# Patient Record
Sex: Female | Born: 1960 | Race: White | Hispanic: No | Marital: Married | Smoking: Never smoker
Health system: Southern US, Community
[De-identification: ages and names within clinical notes are randomized; demographics above are authoritative.]

## PROBLEM LIST (undated history)

## (undated) DIAGNOSIS — K635 Polyp of colon: Secondary | ICD-10-CM

## (undated) DIAGNOSIS — K649 Unspecified hemorrhoids: Secondary | ICD-10-CM

## (undated) DIAGNOSIS — K449 Diaphragmatic hernia without obstruction or gangrene: Secondary | ICD-10-CM

## (undated) DIAGNOSIS — G43909 Migraine, unspecified, not intractable, without status migrainosus: Secondary | ICD-10-CM

## (undated) DIAGNOSIS — J302 Other seasonal allergic rhinitis: Secondary | ICD-10-CM

## (undated) DIAGNOSIS — K2980 Duodenitis without bleeding: Secondary | ICD-10-CM

## (undated) DIAGNOSIS — K589 Irritable bowel syndrome without diarrhea: Secondary | ICD-10-CM

## (undated) DIAGNOSIS — E785 Hyperlipidemia, unspecified: Secondary | ICD-10-CM

## (undated) DIAGNOSIS — Z800 Family history of malignant neoplasm of digestive organs: Secondary | ICD-10-CM

## (undated) DIAGNOSIS — K259 Gastric ulcer, unspecified as acute or chronic, without hemorrhage or perforation: Secondary | ICD-10-CM

## (undated) DIAGNOSIS — K222 Esophageal obstruction: Secondary | ICD-10-CM

## (undated) DIAGNOSIS — Z8051 Family history of malignant neoplasm of kidney: Secondary | ICD-10-CM

## (undated) DIAGNOSIS — R0602 Shortness of breath: Secondary | ICD-10-CM

## (undated) DIAGNOSIS — L900 Lichen sclerosus et atrophicus: Secondary | ICD-10-CM

## (undated) HISTORY — DX: Migraine, unspecified, not intractable, without status migrainosus: G43.909

## (undated) HISTORY — DX: Shortness of breath: R06.02

## (undated) HISTORY — DX: Esophageal obstruction: K22.2

## (undated) HISTORY — PX: COLONOSCOPY W/ BIOPSIES: SHX1374

## (undated) HISTORY — DX: Gastric ulcer, unspecified as acute or chronic, without hemorrhage or perforation: K25.9

## (undated) HISTORY — DX: Other seasonal allergic rhinitis: J30.2

## (undated) HISTORY — DX: Unspecified hemorrhoids: K64.9

## (undated) HISTORY — DX: Duodenitis without bleeding: K29.80

## (undated) HISTORY — DX: Polyp of colon: K63.5

## (undated) HISTORY — DX: Family history of malignant neoplasm of kidney: Z80.51

## (undated) HISTORY — DX: Family history of malignant neoplasm of digestive organs: Z80.0

## (undated) HISTORY — DX: Diaphragmatic hernia without obstruction or gangrene: K44.9

## (undated) HISTORY — DX: Lichen sclerosus et atrophicus: L90.0

---

## 2009-11-16 ENCOUNTER — Ambulatory Visit: Payer: Self-pay

## 2010-11-22 ENCOUNTER — Ambulatory Visit: Payer: Self-pay

## 2011-03-08 ENCOUNTER — Ambulatory Visit: Payer: Self-pay | Admitting: Unknown Physician Specialty

## 2011-03-12 LAB — PATHOLOGY REPORT

## 2012-12-01 ENCOUNTER — Ambulatory Visit: Payer: Self-pay

## 2014-07-26 ENCOUNTER — Ambulatory Visit: Payer: Self-pay | Admitting: Nurse Practitioner

## 2014-11-11 DIAGNOSIS — Z800 Family history of malignant neoplasm of digestive organs: Secondary | ICD-10-CM

## 2014-11-11 HISTORY — DX: Family history of malignant neoplasm of digestive organs: Z80.0

## 2014-11-29 ENCOUNTER — Encounter: Payer: Self-pay | Admitting: *Deleted

## 2014-11-30 ENCOUNTER — Ambulatory Visit: Payer: No Typology Code available for payment source | Admitting: Anesthesiology

## 2014-11-30 ENCOUNTER — Encounter: Payer: Self-pay | Admitting: Anesthesiology

## 2014-11-30 ENCOUNTER — Encounter
Admission: RE | Disposition: A | Discharge status: Home / Self Care | Payer: Self-pay | Source: Ambulatory Visit | Attending: Unknown Physician Specialty

## 2014-11-30 ENCOUNTER — Ambulatory Visit
Admission: RE | Admit: 2014-11-30 | Discharge: 2014-11-30 | Disposition: A | Discharge status: Home / Self Care | Payer: No Typology Code available for payment source | Source: Ambulatory Visit | Attending: Unknown Physician Specialty | Admitting: Unknown Physician Specialty

## 2014-11-30 DIAGNOSIS — Z79899 Other long term (current) drug therapy: Secondary | ICD-10-CM | POA: Diagnosis not present

## 2014-11-30 DIAGNOSIS — K319 Disease of stomach and duodenum, unspecified: Secondary | ICD-10-CM | POA: Insufficient documentation

## 2014-11-30 DIAGNOSIS — E785 Hyperlipidemia, unspecified: Secondary | ICD-10-CM | POA: Diagnosis not present

## 2014-11-30 DIAGNOSIS — K222 Esophageal obstruction: Secondary | ICD-10-CM | POA: Insufficient documentation

## 2014-11-30 DIAGNOSIS — K2980 Duodenitis without bleeding: Secondary | ICD-10-CM | POA: Diagnosis not present

## 2014-11-30 DIAGNOSIS — R1310 Dysphagia, unspecified: Secondary | ICD-10-CM | POA: Insufficient documentation

## 2014-11-30 DIAGNOSIS — K259 Gastric ulcer, unspecified as acute or chronic, without hemorrhage or perforation: Secondary | ICD-10-CM | POA: Diagnosis not present

## 2014-11-30 DIAGNOSIS — K589 Irritable bowel syndrome without diarrhea: Secondary | ICD-10-CM | POA: Diagnosis not present

## 2014-11-30 HISTORY — PX: SAVORY DILATION: SHX5439

## 2014-11-30 HISTORY — PX: ESOPHAGOGASTRODUODENOSCOPY: SHX5428

## 2014-11-30 HISTORY — DX: Hyperlipidemia, unspecified: E78.5

## 2014-11-30 HISTORY — DX: Irritable bowel syndrome, unspecified: K58.9

## 2014-11-30 SURGERY — EGD (ESOPHAGOGASTRODUODENOSCOPY)
Anesthesia: General

## 2014-11-30 MED ORDER — PROPOFOL 10 MG/ML IV BOLUS
INTRAVENOUS | Status: DC | PRN
  Administered 2014-11-30: 50 mg via INTRAVENOUS

## 2014-11-30 MED ORDER — FENTANYL CITRATE (PF) 100 MCG/2ML IJ SOLN
INTRAMUSCULAR | Status: DC | PRN
  Administered 2014-11-30: 50 ug via INTRAVENOUS

## 2014-11-30 MED ORDER — SODIUM CHLORIDE 0.9 % IV SOLN
INTRAVENOUS | Status: DC
  Administered 2014-11-30: 1000 mL via INTRAVENOUS

## 2014-11-30 MED ORDER — SODIUM CHLORIDE 0.9 % IV SOLN: INTRAVENOUS | Status: DC

## 2014-11-30 MED ORDER — GLYCOPYRROLATE 0.2 MG/ML IJ SOLN
INTRAMUSCULAR | Status: DC | PRN
  Administered 2014-11-30: 0.2 mg via INTRAVENOUS

## 2014-11-30 MED ORDER — PROPOFOL INFUSION 10 MG/ML OPTIME
INTRAVENOUS | Status: DC | PRN
  Administered 2014-11-30: 200 ug/kg/min via INTRAVENOUS

## 2014-11-30 MED ORDER — MIDAZOLAM HCL 5 MG/5ML IJ SOLN
INTRAMUSCULAR | Status: DC | PRN
  Administered 2014-11-30: 1 mg via INTRAVENOUS

## 2014-11-30 MED ORDER — LIDOCAINE HCL (PF) 2 % IJ SOLN
INTRAMUSCULAR | Status: DC | PRN
  Administered 2014-11-30: 50 mg

## 2014-11-30 NOTE — Op Note (Signed)
[facility] Gastroenterology Patient Name: Patricia LenteVickie Radel Procedure Date: 11/30/2014 11:33 AM MRN: [id number] Account #: [id number] Date of Birth: [birth year] Admit Type: Outpatient Age: 54 [facility] 4 Gender: Female Note Status: Finalized Procedure:         Upper GI endoscopy Indications:       Dysphagia Providers:         Scot Junobert T. Terence Bart, MD Referring MD:      Gasper Lloydolleen L. Sharen HonesGutierrez (Referring MD) Medicines:         Propofol per Anesthesia Complications:     No immediate complications. Procedure:         Pre-Anesthesia Assessment:                    - After reviewing the risks and benefits, the patient was                     deemed in satisfactory condition to undergo the procedure.                    After obtaining informed consent, the endoscope was passed                     under direct vision. Throughout the procedure, the                     patient's blood pressure, pulse, and oxygen saturations                     were monitored continuously. The Olympus GIF-160 endoscope                     (S#. [id number]) was introduced through the mouth, and                     advanced to the second part of duodenum. The upper GI                     endoscopy was accomplished without difficulty. The patient                     tolerated the procedure well. Findings:      A mild Schatzki ring (acquired) was found at the gastroesophageal       junction. A TTS dilator was passed through the scope. Dilation with a       15-16.5-18 mm balloon (to a maximum balloon size of 18 mm) dilator was       performed. Effect was good with fraacture of the ring.      Few non-bleeding linear gastric ulcers with pigmented material were       found in the gastric body. Biopsies were taken with a cold forceps for       histology. Biopsies were taken with a cold forceps for Helicobacter       pylori testing.      Patchy moderate inflammation characterized by erosions, erythema  and       granularity was found in the duodenal bulb and in the second part of the       duodenum. Impression:        - Mild Schatzki ring. Dilated.                    - Gastric ulcers. Biopsied.                    -  Duodenitis. Recommendation:    - Await pathology results. Scot Jun, MD 11/30/2014 11:59:50 AM This report has been signed electronically. Number of Addenda: 0 Note Initiated On: 11/30/2014 11:33 AM      [facility]

## 2014-11-30 NOTE — Anesthesia Preprocedure Evaluation (Signed)
Anesthesia Evaluation  Patient identified by MRN, date of birth, ID band Patient awake    Reviewed: Allergy & Precautions, NPO status , Patient's Chart, lab work & pertinent test results, reviewed documented beta blocker date and time   Airway Mallampati: II  TM Distance: >3 FB     Dental  (+) Chipped   Pulmonary          Cardiovascular     Neuro/Psych    GI/Hepatic   Endo/Other    Renal/GU      Musculoskeletal   Abdominal   Peds  Hematology   Anesthesia Other Findings   Reproductive/Obstetrics                             Anesthesia Physical Anesthesia Plan  ASA: II  Anesthesia Plan: General   Post-op Pain Management:    Induction: Intravenous  Airway Management Planned: Nasal Cannula  Additional Equipment:   Intra-op Plan:   Post-operative Plan:   Informed Consent: I have reviewed the patients History and Physical, chart, labs and discussed the procedure including the risks, benefits and alternatives for the proposed anesthesia with the patient or authorized representative who has indicated his/her understanding and acceptance.     Plan Discussed with: CRNA  Anesthesia Plan Comments:         Anesthesia Quick Evaluation  

## 2014-11-30 NOTE — H&P (Signed)
Primary Care Physician:  PROVIDER NOT IN SYSTEM Primary Gastroenterologist:  Dr. Mechele CollinElliott  Pre-Procedure History & Physical: HPI:  Patricia Franklin is a 54 y.o. female is here for an endoscopy.   Past Medical History  Diagnosis Date  . Hyperlipidemia   . IBS (irritable bowel syndrome)     History reviewed. No pertinent past surgical history.  Prior to Admission medications   Medication Sig Start Date End Date Taking? Authorizing Provider  Calcium Carb-Cholecalciferol 600-800 MG-UNIT TABS Take 1 tablet by mouth.   Yes Historical Provider, MD  loratadine (CLARITIN) 10 MG tablet Take 10 mg by mouth daily.   Yes Historical Provider, MD  omeprazole (PRILOSEC) 40 MG capsule Take 40 mg by mouth daily.   Yes Historical Provider, MD    Allergies as of 10/31/2014  . (Not on File)    History reviewed. No pertinent family history.  History   Social History  . Marital Status: Married    Spouse Name: N/A  . Number of Children: N/A  . Years of Education: N/A   Occupational History  . Not on file.   Social History Main Topics  . Smoking status: Never Smoker   . Smokeless tobacco: Never Used  . Alcohol Use: No  . Drug Use: No  . Sexual Activity: Not on file   Other Topics Concern  . Not on file   Social History Narrative    Review of Systems: See HPI, otherwise negative ROS  Physical Exam: BP 113/73 mmHg  Pulse 72  Temp(Src) 98.7 F (37.1 C) (Tympanic)  Resp 16  Ht 4' 11.5" (1.511 m)  Wt 58.968 kg (130 lb)  BMI 25.83 kg/m2  SpO2 100% General:   Alert,  pleasant and cooperative in NAD Head:  Normocephalic and atraumatic. Neck:  Supple; no masses or thyromegaly. Lungs:  Clear throughout to auscultation.    Heart:  Regular rate and rhythm. Abdomen:  Soft, nontender and nondistended. Normal bowel sounds, without guarding, and without rebound.   Neurologic:  Alert and  oriented x4;  grossly normal neurologically.  Impression/Plan: Patricia Franklin is here for an  endoscopy to be performed for dysphagia  Risks, benefits, limitations, and alternatives regarding  endoscopy have been reviewed with the patient.  Questions have been answered.  All parties agreeable.   Lynnae PrudeELLIOTT, Mccrae Speciale, MD  11/30/2014, 11:40 AM   Primary Care Physician:  PROVIDER NOT IN SYSTEM Primary Gastroenterologist:  Dr. Mechele CollinElliott  Pre-Procedure History & Physical: HPI:  Patricia Franklin is a 54 y.o. female is here for an endoscopy.   Past Medical History  Diagnosis Date  . Hyperlipidemia   . IBS (irritable bowel syndrome)     History reviewed. No pertinent past surgical history.  Prior to Admission medications   Medication Sig Start Date End Date Taking? Authorizing Provider  Calcium Carb-Cholecalciferol 600-800 MG-UNIT TABS Take 1 tablet by mouth.   Yes Historical Provider, MD  loratadine (CLARITIN) 10 MG tablet Take 10 mg by mouth daily.   Yes Historical Provider, MD  omeprazole (PRILOSEC) 40 MG capsule Take 40 mg by mouth daily.   Yes Historical Provider, MD    Allergies as of 10/31/2014  . (Not on File)    History reviewed. No pertinent family history.  History   Social History  . Marital Status: Married    Spouse Name: N/A  . Number of Children: N/A  . Years of Education: N/A   Occupational History  . Not on file.   Social History  Main Topics  . Smoking status: Never Smoker   . Smokeless tobacco: Never Used  . Alcohol Use: No  . Drug Use: No  . Sexual Activity: Not on file   Other Topics Concern  . Not on file   Social History Narrative    Review of Systems: See HPI, otherwise negative ROS  Physical Exam: BP 113/73 mmHg  Pulse 72  Temp(Src) 98.7 F (37.1 C) (Tympanic)  Resp 16  Ht 4' 11.5" (1.511 m)  Wt 58.968 kg (130 lb)  BMI 25.83 kg/m2  SpO2 100% General:   Alert,  pleasant and cooperative in NAD Head:  Normocephalic and atraumatic. Neck:  Supple; no masses or thyromegaly. Lungs:  Clear throughout to auscultation.    Heart:  Regular  rate and rhythm. Abdomen:  Soft, nontender and nondistended. Normal bowel sounds, without guarding, and without rebound.   Neurologic:  Alert and  oriented x4;  grossly normal neurologically.  Impression/Plan: Patricia Franklin is here for an endoscopy to be performed for dysphagia  Risks, benefits, limitations, and alternatives regarding  endoscopy have been reviewed with the patient.  Questions have been answered.  All parties agreeable.   Lynnae Prude, MD  11/30/2014, 11:40 AM

## 2014-11-30 NOTE — Anesthesia Postprocedure Evaluation (Signed)
  Anesthesia Post-op Note  Patient: Patricia Franklin  Procedure(s) Performed: Procedure(s): ESOPHAGOGASTRODUODENOSCOPY (EGD) (N/A) SAVORY DILATION (N/A)  Anesthesia type:General  Patient location: PACU  Post pain: Pain level controlled  Post assessment: Post-op Vital signs reviewed, Patient's Cardiovascular Status Stable, Respiratory Function Stable, Patent Airway and No signs of Nausea or vomiting  Post vital signs: Reviewed and stable  Last Vitals:  Filed Vitals:   11/30/14 1240  BP: 102/80  Pulse: 89  Temp:   Resp: 15    Level of consciousness: awake, alert  and patient cooperative  Complications: No apparent anesthesia complications

## 2014-11-30 NOTE — Transfer of Care (Signed)
Immediate Anesthesia Transfer of Care Note  Patient: Patricia Franklin  Procedure(s) Performed: Procedure(s): ESOPHAGOGASTRODUODENOSCOPY (EGD) (N/A) SAVORY DILATION (N/A)  Patient Location: PACU  Anesthesia Type:General  Level of Consciousness: sedated  Airway & Oxygen Therapy: Patient Spontanous Breathing and Patient connected to nasal cannula oxygen  Post-op Assessment: Report given to RN and Post -op Vital signs reviewed and stable  Post vital signs: Reviewed and stable  Last Vitals:  Filed Vitals:   11/30/14 1158  BP:   Pulse: 93  Temp: 35.9 C  Resp: 12    Complications: No apparent anesthesia complications

## 2014-12-02 LAB — SURGICAL PATHOLOGY

## 2014-12-05 ENCOUNTER — Encounter: Payer: Self-pay | Admitting: Unknown Physician Specialty

## 2015-05-14 DIAGNOSIS — L900 Lichen sclerosus et atrophicus: Secondary | ICD-10-CM

## 2015-05-14 HISTORY — DX: Lichen sclerosus et atrophicus: L90.0

## 2015-12-18 ENCOUNTER — Other Ambulatory Visit: Payer: Self-pay | Admitting: Certified Nurse Midwife

## 2015-12-18 DIAGNOSIS — Z1231 Encounter for screening mammogram for malignant neoplasm of breast: Secondary | ICD-10-CM

## 2016-01-04 ENCOUNTER — Ambulatory Visit
Admission: RE | Admit: 2016-01-04 | Discharge: 2016-01-04 | Disposition: A | Discharge status: Home / Self Care | Payer: BLUE CROSS/BLUE SHIELD | Source: Ambulatory Visit | Attending: Certified Nurse Midwife | Admitting: Certified Nurse Midwife

## 2016-01-04 DIAGNOSIS — Z1231 Encounter for screening mammogram for malignant neoplasm of breast: Secondary | ICD-10-CM | POA: Insufficient documentation

## 2016-02-11 HISTORY — PX: VULVA /PERINEUM BIOPSY: SHX319

## 2016-11-18 ENCOUNTER — Ambulatory Visit
Admission: EM | Admit: 2016-11-18 | Discharge: 2016-11-18 | Disposition: A | Discharge status: Home / Self Care | Payer: BLUE CROSS/BLUE SHIELD | Attending: Family Medicine | Admitting: Family Medicine

## 2016-11-18 DIAGNOSIS — S93602A Unspecified sprain of left foot, initial encounter: Secondary | ICD-10-CM | POA: Diagnosis not present

## 2016-11-18 DIAGNOSIS — M79672 Pain in left foot: Secondary | ICD-10-CM

## 2016-11-18 NOTE — ED Triage Notes (Signed)
Patient stated she has had swelling since June 22nd, and it seemed to get worse at the beach last week. Did a lot of walking. It doesn't swell until up in the day.

## 2016-11-18 NOTE — ED Provider Notes (Signed)
[facility]    CSN: [id number] Arrival date & time: 11/18/16  0815     History   Chief Complaint Chief Complaint  Patient presents with  . Foot Pain    HPI Patricia Franklin is a 56 y.o. female.   56 yo female with a c/o approximately 2 weeks h/o intermittent left foot swelling over the top of the foot. Patient does not recall any specific injury or trauma. States symptoms began after one day doing a lot of ladder climbing, the worsened after walking a lot on the sand at the beach. Denies any redness, fevers, chills.    The history is provided by the patient.  Foot Pain     Past Medical History:  Diagnosis Date  . Hyperlipidemia   . IBS (irritable bowel syndrome)     There are no active problems to display for this patient.   Past Surgical History:  Procedure Laterality Date  . COLONOSCOPY W/ BIOPSIES  04/2016  . ESOPHAGOGASTRODUODENOSCOPY N/A 11/30/2014   Procedure: ESOPHAGOGASTRODUODENOSCOPY (EGD);  Surgeon: Scot Junobert T Elliott, MD;  [facility];  Service: Endoscopy;  Laterality: N/A;  . SAVORY DILATION N/A 11/30/2014   Procedure: SAVORY DILATION;  Surgeon: Scot Junobert T Elliott, MD;  [facility];  Service: Endoscopy;  Laterality: N/A;    OB History    No data available       Home Medications    Prior to Admission medications   Medication Sig Start Date End Date Taking? Authorizing Provider  loratadine (CLARITIN) 10 MG tablet Take 10 mg by mouth daily.   Yes [provider]  Calcium Carb-Cholecalciferol 600-800 MG-UNIT TABS Take 1 tablet by mouth.    [provider]  omeprazole (PRILOSEC) 40 MG capsule Take 40 mg by mouth daily.    [provider]    Family History History reviewed. No pertinent family history.  Social History Social History  Substance Use Topics  . Smoking status: Never Smoker  . Smokeless tobacco: Never Used  . Alcohol use No     Allergies   Patient has no known  allergies.   Review of Systems Review of Systems   Physical Exam Triage Vital Signs ED Triage Vitals  Enc Vitals Group     BP 11/18/16 0901 96/72     Pulse Rate 11/18/16 0901 63     Resp --      Temp 11/18/16 0901 98 F (36.7 C)     Temp Source 11/18/16 0901 Oral     SpO2 11/18/16 0901 98 %     Weight 11/18/16 0905 120 lb (54.4 kg)     Height 11/18/16 0905 4\' 11"  (1.499 m)     Head Circumference --      Peak Flow --      Pain Score 11/18/16 0906 0     Pain Loc --      Pain Edu? --      Excl. in GC? --    No data found.   Updated Vital Signs BP 96/72 (BP Location: Left Arm)   Pulse 63   Temp 98 F (36.7 C) (Oral)   Ht 4\' 11"  (1.499 m)   Wt 120 lb (54.4 kg)   SpO2 98%   BMI 24.24 kg/m   Visual Acuity Right Eye Distance:   Left Eye Distance:   Bilateral Distance:    Right Eye Near:   Left Eye Near:    Bilateral Near:     Physical Exam  Constitutional:  She appears well-developed and well-nourished. No distress.  Musculoskeletal:       Left foot: There is tenderness (mild over the lateral dorsal aspect of foot) and swelling (mild over lateral dorsal aspect of foot). There is normal range of motion, no bony tenderness, normal capillary refill, no crepitus, no deformity and no laceration.  Left foot with normal range of motion, strength and neurovascularly intact  Skin: She is not diaphoretic.  Nursing note and vitals reviewed.    UC Treatments / Results  Labs (all labs ordered are listed, but only abnormal results are displayed) Labs Reviewed - No data to display  EKG  EKG Interpretation None       Radiology No results found.  Procedures Procedures (including critical care time)  Medications Ordered in UC Medications - No data to display   Initial Impression / Assessment and Plan / UC Course  I have reviewed the triage vital signs and the nursing notes.  Pertinent labs & imaging results that were available during my care of the patient  were reviewed by me and considered in my medical decision making (see chart for details).       Final Clinical Impressions(s) / UC Diagnoses   Final diagnoses:  Sprain of left foot, initial encounter    New Prescriptions Discharge Medication List as of 11/18/2016  9:46 AM     1. diagnosis reviewed with patient 2. Recommend supportive treatment with rest, ice, elevation, otc analgesics/NSAIDs prn   3. Follow-up prn if symptoms worsen or don't improve   Payton Mccallum, MD 11/18/16 1242

## 2016-11-18 NOTE — Discharge Instructions (Signed)
Rest, ice, elevation, ibuprofen as needed

## 2016-11-29 ENCOUNTER — Other Ambulatory Visit: Payer: Self-pay | Admitting: Certified Nurse Midwife

## 2016-11-29 DIAGNOSIS — Z1231 Encounter for screening mammogram for malignant neoplasm of breast: Secondary | ICD-10-CM

## 2016-12-26 ENCOUNTER — Encounter: Payer: Self-pay | Admitting: Certified Nurse Midwife

## 2016-12-26 NOTE — Progress Notes (Signed)
Gynecology Annual Exam  PCP: System, Provider Not In  Chief Complaint:  Chief Complaint  Patient presents with  . Gynecologic Exam    History of Present Illness:Patricia Franklin presents today for her annual exam. She is a 56 year old Caucasian/White postmenopausal female , G 1 P 1 0 0 1 , whose LMP was about 7 years ago . She is having no significant GYN problems, but has hemorrhoidal problems, pain and sometimes bleeding. Has IBS and alternates between diarrhea and constipation. She is using Analpram HC 2.5% at times and Prep H wipes and witch hazel compresses prn with some relief. Is not sure if she desires surgical treatment of hemorrhoids at this time.   She has had no spotting and her hot flashes are infrequent. She is not currently sexually active.  The patient's past medical history is notable for a history of hiatal hernia, duodenitis, gastric ulcers, mild hyperlipidemia, migraine headaches, seasonal allergies and she has a Schatzki's ring..  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 She was responding to clobetasol cream, but has stopped using cream because was not having any itching.  Her most recent pap smear was obtained 11/25/2013 and was NIL/neg.  Her most recent mammogram obtained on 01/04/2016 was normal and revealed no significant changes. There is no family history of breast cancer. There is no family history of ovarian cancer. The patient does do monthly self breast exams.  She had a colonoscopy in 04/18/2016 that revealed an adenomatous polyp . Her next colonoscopy is due in 5 years. She had a recent DEXA scan obtained in 12/01/2012 that was normal.  The patient does not smoke.  The patient does drink occasionally.  The patient does not use illegal drugs.  The patient exercises regularly.  The patient does not get adequate calcium in her diet due to some lactose intolerance issues, but does  take calcium supplements.  She had a recent cholesterol screen in 2017 that was borderline (Total 224, HDL 59, LDL 140, tri 124).    The patient denies current symptoms of depression.    Review of Systems: Review of Systems  Constitutional: Negative for chills, fever and weight loss.  HENT: Negative for congestion, sinus pain and sore throat.   Eyes: Negative for blurred vision and pain.  Respiratory: Negative for hemoptysis, shortness of breath and wheezing.   Cardiovascular: Negative for chest pain, palpitations and leg swelling.  Gastrointestinal: Positive for blood in stool (occasionally with BM), constipation and diarrhea. Negative for abdominal pain, heartburn, nausea and vomiting.       Also positive for "feeling like something is getting stuck when I swallow" Positive for hemorrhoidal pain  Genitourinary: Negative for dysuria, frequency, hematuria and urgency.  Musculoskeletal: Negative for back pain, joint pain and myalgias.       Positive for left foot pain from injury  Skin: Negative for itching and rash.  Neurological: Negative for dizziness, tingling and headaches.  Endo/Heme/Allergies: Positive for environmental allergies. Negative for polydipsia. Does not bruise/bleed easily.       Negative for hirsutism   Psychiatric/Behavioral: Negative for depression. The patient is not nervous/anxious and does not have insomnia.     Past Medical History:  Past Medical History:  Diagnosis Date  . Colon polyps   . Duodenitis   . Hiatal hernia   . Hyperlipidemia   . IBS (irritable bowel syndrome)   . Lichen sclerosus  et atrophicus 2017  . Migraine   . Multiple gastric ulcers   . Schatzki's ring   . Seasonal allergies     Past Surgical History:  Past Surgical History:  Procedure Laterality Date  . COLONOSCOPY W/ BIOPSIES  02/2011 and 04/2016   colon polyps  . ESOPHAGOGASTRODUODENOSCOPY N/A 11/30/2014   Procedure: ESOPHAGOGASTRODUODENOSCOPY (EGD);  Surgeon: Scot Jun, MD;  Location: [facility] ENDOSCOPY;  Service: Endoscopy;  Laterality: N/A;  . SAVORY DILATION N/A 11/30/2014   Procedure: SAVORY DILATION;  Surgeon: Scot Jun, MD;  Location[facility] ENDOSCOPY;  Service: Endoscopy;  Laterality: N/A;  . VULVA Ples Specter BIOPSY  02/2016    Family History:  Family History  Problem Relation Age of Onset  . Alzheimer's disease Mother        moved to [facility] in 2018  . Endometrial cancer Mother 71  . Brain cancer Father 65  . Hypertension Father   . Hyperlipidemia Brother   . Stomach cancer Paternal Grandmother 56  . Melanoma Paternal Grandfather 40  . Alzheimer's disease Maternal Uncle        two maternal uncles  . Breast cancer Cousin        paternal half  cousin  . Colon cancer Cousin        paternal half cousin  . Heart disease Paternal Uncle     Social History:  Social History   Social History  . Marital status: Married    Spouse name: N/A  . Number of children: 1  . Years of education: 19   Occupational History  . Accountant     BS from [employer]   Social History Main Topics  . Smoking status: Never Smoker  . Smokeless tobacco: Never Used  . Alcohol use Yes     Comment: occasional  . Drug use: No  . Sexual activity: Not Currently    Partners: Male    Birth control/ protection: Post-menopausal   Other Topics Concern  . Not on file   Social History Narrative  . No narrative on file    Allergies:  No Known Allergies  Medications:  Current Outpatient Prescriptions:  .  Calcium Carb-Cholecalciferol 600-800 MG-UNIT TABS, Take 1 tablet by mouth., Disp: , Rfl:  .  cholecalciferol (VITAMIN D) 1000 units tablet, Take 1,000 Units by mouth daily., Disp: , Rfl:  .  loratadine (CLARITIN) 10 MG tablet, Take 10 mg by mouth daily., Disp: , Rfl:  .  omeprazole (PRILOSEC) 40 MG capsule, Take 1 capsule (40 mg total) by mouth daily as needed., Disp: 30 capsule, Rfl: 11 .  clobetasol cream (TEMOVATE) 0.05 %, Apply to vulva daily prn  whitening or itching, Disp: 30 g, Rfl: 1 .  hydrocortisone-pramoxine (ANALPRAM HC) 2.5-1 % rectal cream, Place 1 application rectally 3 (three) times daily as needed for hemorrhoids or anal itching., Disp: 30 g, Rfl: 1 Physical Exam Vitals: BP (!) 98/58   Pulse 66   Ht 4\' 11"  (1.499 m)   Wt 55.3 kg (122 lb)   BMI 24.64 kg/m   General: WF in NAD HEENT: normocephalic, anicteric Neck: no thyroid enlargement, no palpable nodules, no cervical lymphadenopathy  Pulmonary: No increased work of breathing, CTAB Cardiovascular: RRR, without murmur  Breast: Breast symmetrical, no tenderness, no palpable nodules or masses, no skin or nipple retraction present, no nipple discharge.  No axillary, infraclavicular or supraclavicular lymphadenopathy. Abdomen: Soft, non-tender, non-distended.  Umbilicus without lesions.  No hepatomegaly or masses palpable. No evidence of hernia. Genitourinary:  External:  Normal urethral meatus, normal Bartholin's and Skene's glands. Slight whitening of right sulcus between labia minora and labia majora. There is also whitening of the perineum just above the anus and to each side of the anus.  Vagina: atrophic vaginal mucosa, no evidence of prolapse,    Cervix: Grossly normal in appearance, no bleeding, non-tender  Uterus: Anteverted, small, normal shape and consistency, mobile, and non-tender  Adnexa: No adnexal masses, non-tender  Rectal: external hemorrhoid noted, no bleeding or inflammation  Lymphatic: no evidence of inguinal lymphadenopathy Extremities: no edema, erythema, or tenderness Neurologic: Grossly intact Psychiatric: mood appropriate, affect full     Assessment: 56 y.o. annual gyn exam Hemorrhoidal pain and bleeding IBS  Lichen sclerosis et atrophicus   Plan:  Hemorrhoids: recommend Benefiber 1-2 times/day to help with IBS which may help relieve hemorrhoidal discomfort from alternating constipation and diarrhea. Refill of Analpram HC 2.5% also sent  to pharmacy. Will refer to surgeon or proctologist if desires surgical treatment. Lichen sclerosis: refill of Clobetasol given to patient. To use daily x 30 days and to observe for diminishing whitening of vulva  1) Breast cancer screening - recommend monthly self breast exam and annual screening mammograms.  She has her mammogram already scheduled for later this month  2) Colon cancer screening: UTD. Refilled her prilosec 40 mgm.   3) Cervical cancer screening - Pap was done.   Patient opts for every 3 years screening interval  4) Routine healthcare maintenance: Repeat fasting lipid panel done today   Farrel Conners, CNM

## 2016-12-27 ENCOUNTER — Ambulatory Visit: Payer: Self-pay | Admitting: Certified Nurse Midwife

## 2016-12-27 ENCOUNTER — Encounter: Payer: Self-pay | Admitting: Certified Nurse Midwife

## 2016-12-27 ENCOUNTER — Ambulatory Visit (INDEPENDENT_AMBULATORY_CARE_PROVIDER_SITE_OTHER): Payer: BLUE CROSS/BLUE SHIELD | Admitting: Certified Nurse Midwife

## 2016-12-27 VITALS — BP 98/58 | HR 66 | Ht 59.0 in | Wt 122.0 lb

## 2016-12-27 DIAGNOSIS — K582 Mixed irritable bowel syndrome: Secondary | ICD-10-CM | POA: Diagnosis not present

## 2016-12-27 DIAGNOSIS — Z124 Encounter for screening for malignant neoplasm of cervix: Secondary | ICD-10-CM | POA: Diagnosis not present

## 2016-12-27 DIAGNOSIS — Z13220 Encounter for screening for lipoid disorders: Secondary | ICD-10-CM

## 2016-12-27 DIAGNOSIS — K635 Polyp of colon: Secondary | ICD-10-CM | POA: Insufficient documentation

## 2016-12-27 DIAGNOSIS — Z01419 Encounter for gynecological examination (general) (routine) without abnormal findings: Secondary | ICD-10-CM | POA: Diagnosis not present

## 2016-12-27 DIAGNOSIS — J302 Other seasonal allergic rhinitis: Secondary | ICD-10-CM | POA: Diagnosis not present

## 2016-12-27 DIAGNOSIS — K649 Unspecified hemorrhoids: Secondary | ICD-10-CM | POA: Diagnosis not present

## 2016-12-27 DIAGNOSIS — L900 Lichen sclerosus et atrophicus: Secondary | ICD-10-CM | POA: Insufficient documentation

## 2016-12-27 DIAGNOSIS — K589 Irritable bowel syndrome without diarrhea: Secondary | ICD-10-CM | POA: Insufficient documentation

## 2016-12-27 DIAGNOSIS — K222 Esophageal obstruction: Secondary | ICD-10-CM | POA: Insufficient documentation

## 2016-12-27 DIAGNOSIS — K449 Diaphragmatic hernia without obstruction or gangrene: Secondary | ICD-10-CM | POA: Insufficient documentation

## 2016-12-27 DIAGNOSIS — K2980 Duodenitis without bleeding: Secondary | ICD-10-CM | POA: Insufficient documentation

## 2016-12-27 DIAGNOSIS — K648 Other hemorrhoids: Secondary | ICD-10-CM | POA: Insufficient documentation

## 2016-12-27 MED ORDER — OMEPRAZOLE 40 MG PO CPDR: 40.0000 mg | DELAYED_RELEASE_CAPSULE | Freq: Every day | ORAL | 11 refills | Status: DC | PRN

## 2016-12-27 MED ORDER — CLOBETASOL PROPIONATE 0.05 % EX CREA: TOPICAL_CREAM | CUTANEOUS | 1 refills | Status: DC

## 2016-12-27 MED ORDER — HYDROCORTISONE ACE-PRAMOXINE 2.5-1 % RE CREA: 1.0000 "application " | TOPICAL_CREAM | Freq: Three times a day (TID) | RECTAL | 1 refills | Status: DC | PRN

## 2016-12-28 LAB — LIPID PANEL
Chol/HDL Ratio: 3.6 ratio (ref 0.0–4.4)
Cholesterol, Total: 218 mg/dL — ABNORMAL HIGH (ref 100–199)
HDL: 60 mg/dL (ref >39)
LDL CALC: 141 mg/dL — AB (ref 0–99)
Triglycerides: 84 mg/dL (ref 0–149)
VLDL CHOLESTEROL CAL: 17 mg/dL (ref 5–40)

## 2016-12-31 ENCOUNTER — Encounter: Payer: Self-pay | Admitting: Certified Nurse Midwife

## 2017-01-01 LAB — IGP, APTIMA HPV
HPV Aptima: NEGATIVE
PAP Smear Comment: 0

## 2017-01-07 ENCOUNTER — Ambulatory Visit
Admission: RE | Admit: 2017-01-07 | Discharge: 2017-01-07 | Disposition: A | Discharge status: Home / Self Care | Payer: BLUE CROSS/BLUE SHIELD | Source: Ambulatory Visit | Attending: Certified Nurse Midwife | Admitting: Certified Nurse Midwife

## 2017-01-07 DIAGNOSIS — Z1231 Encounter for screening mammogram for malignant neoplasm of breast: Secondary | ICD-10-CM | POA: Insufficient documentation

## 2017-04-30 ENCOUNTER — Other Ambulatory Visit: Payer: Self-pay | Admitting: Certified Nurse Midwife

## 2017-04-30 MED ORDER — CLOBETASOL PROPIONATE 0.05 % EX OINT: TOPICAL_OINTMENT | CUTANEOUS | 1 refills | Status: DC

## 2017-05-11 ENCOUNTER — Other Ambulatory Visit: Payer: Self-pay | Admitting: Certified Nurse Midwife

## 2017-11-06 ENCOUNTER — Telehealth: Payer: Self-pay

## 2017-11-06 NOTE — Telephone Encounter (Signed)
Pt states last year it was mentioned for her to have a ref re hemorrhoids.  Would now like to do that as 'they are getting out of hand'.  She would like a doctor she can call.  [phone].  Pt doesn't have a Development worker, international aidgeneral surgeon.  Adv Dr. Lemar LivingsByrnett, ck ins first to see if ref needed from us.  Pt states she doesn't have time for all that paperwork and may not do anything.

## 2017-11-06 NOTE — Telephone Encounter (Signed)
Pt states her ins doesn't require a referral but Dr. Rutherford NailByrnett's office is referral only.  So, Pt needs referral to Dr. Rutherford NailByrnett's office for hemorrhoids.  [phone]

## 2017-11-07 ENCOUNTER — Other Ambulatory Visit: Payer: Self-pay | Admitting: Certified Nurse Midwife

## 2017-11-07 DIAGNOSIS — K649 Unspecified hemorrhoids: Secondary | ICD-10-CM

## 2017-11-07 NOTE — Progress Notes (Signed)
Spoke with Patricia Franklin about painful, sometimes bleeding hemorrhoids that are no longer responding to medical treatment. Will refer to Dr Lemar LivingsByrnett for evaluation and treatment. Farrel Connersolleen Ferd Horrigan, CNM

## 2017-11-07 NOTE — Telephone Encounter (Signed)
Will refer to Dr Shara BlazingBrynett

## 2017-11-17 ENCOUNTER — Encounter: Payer: Self-pay | Admitting: *Deleted

## 2017-11-18 ENCOUNTER — Ambulatory Visit (INDEPENDENT_AMBULATORY_CARE_PROVIDER_SITE_OTHER): Payer: BLUE CROSS/BLUE SHIELD | Admitting: General Surgery

## 2017-11-18 ENCOUNTER — Encounter: Payer: Self-pay | Admitting: General Surgery

## 2017-11-18 VITALS — BP 130/72 | HR 72 | Resp 12 | Ht 59.0 in | Wt 119.0 lb

## 2017-11-18 DIAGNOSIS — K648 Other hemorrhoids: Secondary | ICD-10-CM | POA: Diagnosis not present

## 2017-11-18 NOTE — Progress Notes (Signed)
Patient ID: Patricia Franklin, female   DOB: 1960/06/12, 57 y.o.   MRN: [id number]  Chief Complaint  Patient presents with  . Other    HPI Patricia RamalVickie H Hackley is a 57 y.o. female here today for a evaluation of painful hemorrhoids. Patient states she has had trouble for 30 years.In the last five years off and on bleeding. In March 2019 she states the pain just push her over the edge. Painful to move her bowels, feel like razor  blades. Some discharge noticed about bowel movements.  Moves her bowels daily.Soft stools. She has tried using Analpram and other creams,Hemp oil  has not helped.   Past Medical History:  Diagnosis Date  . Colon polyps   . Duodenitis   . Hiatal hernia   . Hyperlipidemia   . IBS (irritable bowel syndrome)   . Lichen sclerosus et atrophicus 2017  . Migraine   . Multiple gastric ulcers   . Schatzki's ring   . Seasonal allergies   . Shortness of breath     Past Surgical History:  Procedure Laterality Date  . COLONOSCOPY W/ BIOPSIES  02/2011 and 04/2016   colon polyps  . ESOPHAGOGASTRODUODENOSCOPY N/A 11/30/2014   Procedure: ESOPHAGOGASTRODUODENOSCOPY (EGD);  Surgeon: Scot Junobert T Elliott, MD;  [facility];  Service: Endoscopy;  Laterality: N/A;  . SAVORY DILATION N/A 11/30/2014   Procedure: SAVORY DILATION;  Surgeon: Scot Junobert T Elliott, MD;  [facility] END[facility]COPY;  Service: Endoscopy;  Laterality: N/A;  . VULVA /PERINEUM BIOPSY  02/2016    Family History  Problem Relation Age of Onset  . Alzheimer's disease Mother        [place]  . Endometrial cancer Mother [id number]  . Brain cancer Father 1765  . Hypertension Father   . Hyperlipidemia Brother   . Stomach cancer Paternal Grandmother 4350  . Melanoma Paternal Grandfather [id number]  . Alzheimer's disease Maternal Uncle        two maternal uncles  . Breast cancer Cousin        paternal half  cousin  . Colon cancer Cousin        paternal half cousin  . Heart disease Paternal Uncle     Social  History Social History   Tobacco Use  . Smoking status: Never Smoker  . Smokeless tobacco: Never Used  Substance Use Topics  . Alcohol use: Yes    Comment: occasional  . Drug use: No    No Known Allergies  Current Outpatient Medications  Medication Sig Dispense Refill  . ANALPRAM HC 2.5-1 % rectal cream USE RECTALLY THREE TIMES DAILY AS NEEDED FOR HEMORRHOIDS OR ANAL ITCHING 30 g 0  . Calcium Carb-Cholecalciferol 600-800 MG-UNIT TABS Take 1 tablet by mouth.    . cholecalciferol (VITAMIN D) 1000 units tablet Take 1,000 Units by mouth daily.    . clobetasol ointment (TEMOVATE) 0.05 % Apply to affected area 2-3 times a week until resolution of symptoms. 30 g 1  . Docosahexaenoic Acid (DHA PO) Take 500 mg by mouth.    . loratadine (CLARITIN) 10 MG tablet Take 10 mg by mouth daily.    . Nutritional Supplements (VITAMIN D BOOSTER PO) Take by mouth.    Marland Kitchen. omeprazole (PRILOSEC) 40 MG capsule Take 1 capsule (40 mg total) by mouth daily as needed. 30 capsule 11   No current facility-administered medications for this visit.     Review of Systems Review of Systems  Constitutional: Negative.   Respiratory: Negative.  Cardiovascular: Negative.     Blood pressure 130/72, pulse 72, resp. rate 12, height 4\' 11"  (1.499 m), weight 119 lb (54 kg).  Physical Exam Physical Exam  Constitutional: She is oriented to person, place, and time. She appears well-developed and well-nourished.  Cardiovascular: Normal rate, regular rhythm and normal heart sounds.  Pulmonary/Chest: Effort normal and breath sounds normal.  Abdominal: Soft. Bowel sounds are normal.  Genitourinary: Rectal exam shows internal hemorrhoid.     Neurological: She is alert and oriented to person, place, and time.  Skin: Skin is warm and dry.    Data Reviewed Most recent colonoscopy was completed out of town and is not available for review.  Tubular adenoma reportedly removed from the ascending colon in December 2017.      Assessment    Rectal bleeding secondary to internal hemorrhoids.    Plan  Indications for hemorrhoidectomy were reviewed.  With the identification of a polypoid lesion on the one hemorrhoid I think excisional therapy would be appropriate to obtain pathologic review of the tissue.  Potential for anorectal dysfunction reviewed.  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  Early return may be possible.  She is been asked to make use of a fleets enema 1 hour prior to presenting to the hospital when she elects to proceed to surgery.  Discussed hemorrhoidectomy.   HPI, Physical Exam, Assessment and Plan have been scribed under the direction and in the presence of Donnalee Curry, MD.  Ples Specter, CMA   I have completed the exam and reviewed the above documentation for accuracy and completeness.  I agree with the above.  Museum/gallery conservator has been used and any errors in dictation or transcription are unintentional.  Donnalee Curry, M.D., F.A.C.S.  Merrily Pew Timonthy Hovater 11/18/2017, 8:40 PM

## 2017-11-18 NOTE — Patient Instructions (Signed)
Patient to have hemorrhoidectomy surgery.

## 2018-02-11 ENCOUNTER — Ambulatory Visit: Payer: BLUE CROSS/BLUE SHIELD | Admitting: Certified Nurse Midwife

## 2018-02-16 ENCOUNTER — Telehealth: Payer: Self-pay | Admitting: *Deleted

## 2018-02-16 NOTE — Telephone Encounter (Signed)
Patient was contacted today to arrange surgery. She had requested to be contacted once the November schedule was available.   Patient's hemorrhoidectomy has been scheduled for 04-06-18 at [facility] with Dr. Lemar Livings. She will use one fleets enema prior to arrival at hospital day of surgery.   The patient reports no change in medical history or medications since her last office visit.   A pre-op visit will not be required with Dr. Lemar Livings. History and physical will be updated the morning of procedure.   The patient is aware to call the office should they have further questions.

## 2018-03-23 NOTE — Progress Notes (Signed)
Gynecology Annual Exam  PCP: System, Provider Not In  Chief Complaint:  Chief Complaint  Patient presents with  . Gynecologic Exam    tired a lot; feels crudy today as in cold sxs    History of Present Illness:Patricia Franklin presents today for her annual exam. She is a 57 year old Caucasian/White postmenopausal female , G 1 P 1 0 0 1 , whose LMP was about 8 years ago . She is having no significant GYN problems, but has hemorrhoidal problems, pain and sometimes bleeding. Has IBS and alternates between diarrhea and constipation which also aggavates the hemorrhoids. Medical treatment has no longer been helpful and she is now scheduled for surgery 11/25 with Dr Lemar Livings. She currently has an URI or allergy symptoms.  She has had no spotting and her hot flashes are infrequent. She is not currently sexually active.  The patient's past medical history is notable for a history of hiatal hernia, duodenitis, gastric ulcers, mild hyperlipidemia, migraine headaches, lichen sclerosis et atrophicus, seasonal allergies and she has a Schatzki's ring.. She uses clobetasol occasionally for lichen symptoms. After her last annual GYN exam dated Jan 14, 2017 , her mother died from a hemorrhagic stroke.  She also reports feeling tired and fatigued. She has been eating little meat, as she eats with her vegan daughter.  Her most recent pap smear was obtained 2017-01-14 and was NIL/neg.  Her most recent mammogram obtained on 01/07/17 was normal and revealed no significant changes. There is no family history of breast cancer. There is no family history of ovarian cancer. The patient does do monthly self breast exams.  She had a colonoscopy in 04/18/2016 that revealed an adenomatous polyp . Her next colonoscopy is due in 5 years. She had a recent DEXA scan obtained in 12/01/2012 that was normal.  The patient does not smoke.  The patient does drink a glass a day. The patient does not use illegal drugs.  The patient  exercises occasionally by walking on a Treadmill.  The patient does not get adequate calcium in her diet due to some lactose intolerance issues, and stopped taking calcium supplements.  She had a recent cholesterol screen in 2018 that was borderline (Total 218, HDL 60, LDL 141, tri 84).    The patient denies current symptoms of depression.    Review of Systems: Review of Systems  Constitutional: Positive for malaise/fatigue. Negative for chills, fever and weight loss.  HENT: Positive for congestion and sore throat. Negative for sinus pain.   Eyes: Negative for blurred vision and pain.  Respiratory: Negative for hemoptysis, shortness of breath and wheezing.   Cardiovascular: Negative for chest pain, palpitations and leg swelling.  Gastrointestinal: Positive for constipation and diarrhea. Negative for abdominal pain, heartburn, nausea and vomiting.       Positive for hemorrhoidal pain  Genitourinary: Negative for dysuria, frequency, hematuria and urgency.  Musculoskeletal: Negative for back pain, joint pain and myalgias.  Skin: Negative for itching and rash.  Neurological: Negative for dizziness, tingling and headaches.  Endo/Heme/Allergies: Positive for environmental allergies. Negative for polydipsia. Does not bruise/bleed easily.       Negative for hirsutism   Psychiatric/Behavioral: Negative for depression. The patient is not nervous/anxious and does not have insomnia.     Past Medical History:  Past Medical History:  Diagnosis Date  . Colon polyps   . Duodenitis   . Family history of colon cancer 11/2014   qualifies for [facility] genetic testing; letter sent  .  Family history of kidney cancer   . Hiatal hernia   . Hyperlipidemia   . IBS (irritable bowel syndrome)   . Lichen sclerosus et atrophicus 2017  . Migraine   . Multiple gastric ulcers   . Schatzki's ring   . Seasonal allergies   . Shortness of breath     Past Surgical History:  Past Surgical History:    Procedure Laterality Date  . COLONOSCOPY W/ BIOPSIES  02/2011 and 04/2016   colon polyps  . ESOPHAGOGASTRODUODENOSCOPY N/A 11/30/2014   Procedure: ESOPHAGOGASTRODUODENOSCOPY (EGD);  Surgeon: Scot Jun, MD;  Location: [facility] ENDOSCOPY;  Service: Endoscopy;  Laterality: N/A;  . SAVORY DILATION N/A 11/30/2014   Procedure: SAVORY DILATION;  Surgeon: Scot Jun, MD;  Location: [facility] ENDOSCOPY;  Service: Endoscopy;  Laterality: N/A;  . VULVA Ples Specter BIOPSY  02/2016    Family History:  Family History  Problem Relation Age of Onset  . Alzheimer's disease Mother        moved to [facility] in 2018  . Endometrial cancer Mother 23  . Cancer Mother   . Stroke Mother 61       deceased - massive brain hemorrhage that lead to a massive stroke  . Brain cancer Father 49  . Hypertension Father   . Hyperlipidemia Brother   . Stomach cancer Paternal Grandmother 58  . Melanoma Paternal Grandfather 4  . Alzheimer's disease Maternal Uncle        two maternal uncles  . Breast cancer Cousin 67       paternal half  cousin  . Colon cancer Cousin 50       paternal half cousin  . Heart disease Paternal Uncle     Social History:  Social History   Socioeconomic History  . Marital status: Married    Spouse name: Not on file  . Number of children: 1  . Years of education: 19  . Highest education level: Not on file  Occupational History  . Occupation: Airline pilot    Comment: BS from [employer]  . Financial resource strain: Not on file  . Food insecurity:    Worry: Not on file    Inability: Not on file  . Transportation needs:    Medical: Not on file    Non-medical: Not on file  Tobacco Use  . Smoking status: Never Smoker  . Smokeless tobacco: Never Used  Substance and Sexual Activity  . Alcohol use: Yes    Comment: occasional  . Drug use: No  . Sexual activity: Not Currently    Partners: Male    Birth control/protection: Post-menopausal  Lifestyle  . Physical  activity:    Days per week: Not on file    Minutes per session: Not on file  . Stress: Not on file  Relationships  . Social connections:    Talks on phone: Not on file    Gets together: Not on file    Attends religious service: Not on file    Active member of club or organization: Not on file    Attends meetings of clubs or organizations: Not on file    Relationship status: Not on file  . Intimate partner violence:    Fear of current or ex partner: Not on file    Emotionally abused: Not on file    Physically abused: Not on file    Forced sexual activity: Not on file  Other Topics Concern  . Not on file  Social History Narrative  .  Not on file    Allergies:  No Known Allergies  Medications:  Current Outpatient Medications:  .  ANALPRAM HC 2.5-1 % rectal cream, USE RECTALLY THREE TIMES DAILY AS NEEDED FOR HEMORRHOIDS OR ANAL ITCHING (Patient taking differently: Place 1 application rectally 3 (three) times daily as needed for anal itching. ), Disp: 30 g, Rfl: 0 .  clobetasol ointment (TEMOVATE) 0.05 %, Apply to affected area 2-3 times a week until resolution of symptoms. (Patient taking differently: Apply 1 application topically daily as needed (skin irritation). Apply to affected area 2-3 times a week until resolution of symptoms.), Disp: 30 g, Rfl: 1 .  Cyanocobalamin (VITAMIN B 12 PO), Take 1,000 mcg by mouth daily. , Disp: , Rfl:  .  loratadine (CLARITIN) 10 MG tablet, Take 10 mg by mouth daily., Disp: , Rfl:  .  Wheat Dextrin (BENEFIBER DRINK MIX PO), Take 1 Scoop by mouth daily. , Disp: , Rfl:  .  cholecalciferol (VITAMIN D) 1000 units tablet, Take 1,000 Units by mouth daily., Disp: , Rfl:  .  omeprazole (PRILOSEC) 40 MG capsule, Take 1 capsule (40 mg total) by mouth daily as needed. (Patient not taking: Reported on 03/24/2018), Disp: 30 capsule, Rfl: 11 Physical Exam Vitals: BP 100/70   Pulse 84   Ht 4' 11.75" (1.518 m)   Wt 119 lb 8 oz (54.2 kg)   BMI 23.53 kg/m    General: WF in NAD HEENT: normocephalic, anicteric Neck: no thyroid enlargement, no palpable nodules, no cervical lymphadenopathy  Pulmonary: No increased work of breathing, CTAB Cardiovascular: RRR, without murmur  Breast: Breast symmetrical, no tenderness, no palpable nodules or masses, no skin or nipple retraction present, no nipple discharge.  No axillary, infraclavicular or supraclavicular lymphadenopathy. Abdomen: Soft, non-tender, non-distended.  Umbilicus without lesions.  No hepatomegaly or masses palpable. No evidence of hernia. Genitourinary:  External:  Normal urethral meatus, normal Bartholin's and Skene's glands. Line of whitening on left sulcus between labia minora and labia majora. Depigmented area on outer right labia minora about the size of a nickle  Vagina: atrophic vaginal mucosa, no evidence of prolapse,    Cervix: Grossly normal in appearance, no bleeding, non-tender  Uterus: Anteverted, small, normal shape and consistency, mobile, and non-tender  Adnexa: No adnexal masses, non-tender  Rectal: external hemorrhoid noted, no bleeding  Lymphatic: no evidence of inguinal lymphadenopathy Extremities: no edema, erythema, or tenderness Neurologic: Grossly intact Psychiatric: mood appropriate, affect full     Assessment: 57 y.o. annual gyn exam Hemorrhoidal pain  Scheduled for surgery later this month Lichen sclerosis et atrophicus  Currently with minimal whitening of left labia and not symptomatic Fatigue   Plan:    1) Breast cancer screening - recommend monthly self breast exam and annual screening mammograms.   Mammogram ordered and patient to schedule  2) Colon cancer screening: UTD.   3) Cervical cancer screening - Pap was done.   Patient opts for yearly screening interval  4) Routine healthcare maintenance: Repeat fasting lipid panel done today, vitamin B12, hemoglobin a1C, CBC  5) RTO in 1 year and prn.  Farrel Conners, CNM

## 2018-03-24 ENCOUNTER — Encounter: Payer: Self-pay | Admitting: Certified Nurse Midwife

## 2018-03-24 ENCOUNTER — Other Ambulatory Visit (HOSPITAL_COMMUNITY)
Admission: RE | Admit: 2018-03-24 | Discharge: 2018-03-24 | Disposition: A | Discharge status: Home / Self Care | Payer: BLUE CROSS/BLUE SHIELD | Source: Ambulatory Visit | Attending: Certified Nurse Midwife | Admitting: Certified Nurse Midwife

## 2018-03-24 ENCOUNTER — Ambulatory Visit (INDEPENDENT_AMBULATORY_CARE_PROVIDER_SITE_OTHER): Payer: BLUE CROSS/BLUE SHIELD | Admitting: Certified Nurse Midwife

## 2018-03-24 VITALS — BP 100/70 | HR 84 | Ht 59.75 in | Wt 119.5 lb

## 2018-03-24 DIAGNOSIS — Z1239 Encounter for other screening for malignant neoplasm of breast: Secondary | ICD-10-CM

## 2018-03-24 DIAGNOSIS — Z01419 Encounter for gynecological examination (general) (routine) without abnormal findings: Secondary | ICD-10-CM

## 2018-03-24 DIAGNOSIS — Z124 Encounter for screening for malignant neoplasm of cervix: Secondary | ICD-10-CM | POA: Diagnosis present

## 2018-03-24 DIAGNOSIS — Z13220 Encounter for screening for lipoid disorders: Secondary | ICD-10-CM

## 2018-03-24 DIAGNOSIS — Z131 Encounter for screening for diabetes mellitus: Secondary | ICD-10-CM

## 2018-03-24 DIAGNOSIS — R5383 Other fatigue: Secondary | ICD-10-CM

## 2018-03-24 DIAGNOSIS — Z789 Other specified health status: Secondary | ICD-10-CM

## 2018-03-25 ENCOUNTER — Encounter: Payer: Self-pay | Admitting: Certified Nurse Midwife

## 2018-03-25 ENCOUNTER — Other Ambulatory Visit: Payer: Self-pay | Admitting: General Surgery

## 2018-03-25 DIAGNOSIS — K648 Other hemorrhoids: Secondary | ICD-10-CM

## 2018-03-25 LAB — CBC WITH DIFFERENTIAL/PLATELET
BASOS: 0 %
Basophils Absolute: 0 10*3/uL (ref 0.0–0.2)
EOS (ABSOLUTE): 0.2 10*3/uL (ref 0.0–0.4)
EOS: 2 %
HEMATOCRIT: 38 % (ref 34.0–46.6)
HEMOGLOBIN: 13 g/dL (ref 11.1–15.9)
Immature Grans (Abs): 0 10*3/uL (ref 0.0–0.1)
Immature Granulocytes: 0 %
LYMPHS ABS: 1.3 10*3/uL (ref 0.7–3.1)
Lymphs: 16 %
MCH: 30.7 pg (ref 26.6–33.0)
MCHC: 34.2 g/dL (ref 31.5–35.7)
MCV: 90 fL (ref 79–97)
MONOCYTES: 5 %
MONOS ABS: 0.4 10*3/uL (ref 0.1–0.9)
NEUTROS ABS: 6.3 10*3/uL (ref 1.4–7.0)
Neutrophils: 77 %
Platelets: 201 10*3/uL (ref 150–450)
RBC: 4.23 x10E6/uL (ref 3.77–5.28)
RDW: 12.4 % (ref 12.3–15.4)
WBC: 8.2 10*3/uL (ref 3.4–10.8)

## 2018-03-25 LAB — LIPID PANEL
CHOLESTEROL TOTAL: 224 mg/dL — AB (ref 100–199)
Chol/HDL Ratio: 3.5 ratio (ref 0.0–4.4)
HDL: 64 mg/dL (ref >39)
LDL Calculated: 140 mg/dL — ABNORMAL HIGH (ref 0–99)
Triglycerides: 102 mg/dL (ref 0–149)
VLDL CHOLESTEROL CAL: 20 mg/dL (ref 5–40)

## 2018-03-25 LAB — HEMOGLOBIN A1C
Est. average glucose Bld gHb Est-mCnc: 100 mg/dL
Hgb A1c MFr Bld: 5.1 % (ref 4.8–5.6)

## 2018-03-25 LAB — VITAMIN B12: VITAMIN B 12: 516 pg/mL (ref 232–1245)

## 2018-03-26 LAB — CYTOLOGY - PAP: DIAGNOSIS: NEGATIVE

## 2018-03-30 ENCOUNTER — Encounter
Admission: RE | Admit: 2018-03-30 | Discharge: 2018-03-30 | Disposition: A | Discharge status: Home / Self Care | Payer: BLUE CROSS/BLUE SHIELD | Source: Ambulatory Visit | Attending: General Surgery | Admitting: General Surgery

## 2018-03-30 ENCOUNTER — Encounter: Payer: Self-pay | Admitting: Certified Nurse Midwife

## 2018-03-30 ENCOUNTER — Other Ambulatory Visit: Payer: Self-pay

## 2018-03-30 NOTE — Patient Instructions (Signed)
Your procedure is scheduled on: 04-06-18  Report to Same Day Surgery 2nd floor medical mall [facility](Medical Mall Entrance-take elevator on left to 2nd floor.  Check in with surgery information desk.) To find out your arrival time please call [phone] between 1PM - 3PM on 04-03-18  Remember: Instructions that are not followed completely may result in serious medical risk, up to and including death, or upon the discretion of your surgeon and anesthesiologist your surgery may need to be rescheduled.    _x___ 1. Do not eat food after midnight the night before your procedure. You may drink clear liquids up to 2 hours before you are scheduled to arrive at the hospital for your procedure.  Do not drink clear liquids within 2 hours of your scheduled arrival to the hospital.  Clear liquids include  --Water or Apple juice without pulp  --Clear carbohydrate beverage such as ClearFast or Gatorade  --Black Coffee or Clear Tea (No milk, no creamers, do not add anything to the coffee or Tea   ____Ensure clear carbohydrate drink on the way to the hospital for bariatric patients  ____Ensure clear carbohydrate drink 3 hours before surgery for Dr Rutherford NailByrnett's patients if physician instructed.   No gum chewing or hard candies.     __x__ 2. No Alcohol for 24 hours before or after surgery.   __x__3. No Smoking or e-cigarettes for 24 prior to surgery.  Do not use any chewable tobacco products for at least 6 hour prior to surgery   ____  4. Bring all medications with you on the day of surgery if instructed.    __x__ 5. Notify your doctor if there is any change in your medical condition     (cold, fever, infections).    x___6. On the morning of surgery brush your teeth with toothpaste and water.  You may rinse your mouth with mouth wash if you wish.  Do not swallow any toothpaste or mouthwash.   Do not wear jewelry, make-up, hairpins, clips or nail polish.  Do not wear lotions, powders, or perfumes. You may wear  deodorant.  Do not shave 48 hours prior to surgery. Men may shave face and neck.  Do not bring valuables to the hospital.    [facility] is not responsible for any belongings or valuables.               Contacts, dentures or bridgework may not be worn into surgery.  Leave your suitcase in the car. After surgery it may be brought to your room.  For patients admitted to the hospital, discharge time is determined by your treatment team.  _  Patients discharged the day of surgery will not be allowed to drive home.  You will need someone to drive you home and stay with you the night of your procedure.    Please read over the following fact sheets that you were given:   [facility] Preparing for Surgery   ____ Take anti-hypertensive listed below, cardiac, seizure, asthma,     anti-reflux and psychiatric medicines. These include:  1. NONE  2.  3.  4.  5.  6.  _X___Fleets enema as directed-AT HOME 1 HOUR PRIOR TO ARRIVAL TIME TO HOSPITAL PER DR BYRNETT'S OFFICE   ____ Use CHG Soap or sage wipes as directed on instruction sheet   ____ Use inhalers on the day of surgery and bring to hospital day of surgery  ____ Stop Metformin and Janumet 2 days prior to surgery.  ____ Take 1/2 of usual insulin dose the night before surgery and none on the morning surgery.   ____ Follow recommendations from Cardiologist, Pulmonologist or PCP regarding stopping Aspirin, Coumadin, Plavix ,Eliquis, Effient, or Pradaxa, and Pletal.  ____Stop Anti-inflammatories such as Advil, Aleve, Ibuprofen, Motrin, Naproxen, Naprosyn, Goodies powders or aspirin products NOW-OK to take Tylenol    ____ Stop supplements until after surgery.     ____ Bring C-Pap to the hospital.

## 2018-03-31 ENCOUNTER — Encounter: Payer: Self-pay | Admitting: Emergency Medicine

## 2018-03-31 ENCOUNTER — Other Ambulatory Visit: Payer: Self-pay

## 2018-03-31 ENCOUNTER — Ambulatory Visit
Admission: EM | Admit: 2018-03-31 | Discharge: 2018-03-31 | Disposition: A | Discharge status: Home / Self Care | Payer: BLUE CROSS/BLUE SHIELD | Attending: Family Medicine | Admitting: Family Medicine

## 2018-03-31 DIAGNOSIS — J069 Acute upper respiratory infection, unspecified: Secondary | ICD-10-CM

## 2018-03-31 MED ORDER — AZITHROMYCIN 250 MG PO TABS: 250.0000 mg | ORAL_TABLET | Freq: Every day | ORAL | 0 refills | Status: DC

## 2018-03-31 MED ORDER — HYDROCOD POLST-CPM POLST ER 10-8 MG/5ML PO SUER: 5.0000 mL | Freq: Two times a day (BID) | ORAL | 0 refills | Status: DC

## 2018-03-31 MED ORDER — BENZONATATE 200 MG PO CAPS: ORAL_CAPSULE | ORAL | 0 refills | Status: DC

## 2018-03-31 NOTE — ED Triage Notes (Signed)
Patient c/o cough and chest congestion for a week.  Patient denies fevers.  °

## 2018-03-31 NOTE — ED Provider Notes (Signed)
[facility]    CSN: [id number] Arrival date & time: 03/31/18  0804     History   Chief Complaint Chief Complaint  Patient presents with  . Cough    HPI Patricia Franklin is a 57 y.o. female.   HPI  57 year old female with cough and chest congestion she has had for a week.  No fever or chills.  The cough is much worse at nighttime it kept her awake most the evening.  Is very little in the way of nasal congestion or mucus.  She is afebrile.  Pulse rate of 68 O2 sats 100% on room air        Past Medical History:  Diagnosis Date  . Colon polyps   . Duodenitis   . Family history of colon cancer 11/2014   qualifies for colaris [facility] genetic testing; letter sent  . Family history of kidney cancer   . Hiatal hernia   . Hyperlipidemia   . IBS (irritable bowel syndrome)   . Lichen sclerosus et atrophicus 2017  . Migraine   . Multiple gastric ulcers   . Schatzki's ring   . Seasonal allergies   . Shortness of breath     Patient Active Problem List   Diagnosis Date Noted  . Lichen sclerosus et atrophicus 12/27/2016  . Internal hemorrhoids 12/27/2016  . Irritable bowel syndrome 12/27/2016  . Seasonal allergies   . Hiatal hernia   . Duodenitis   . Colon polyps   . Schatzki's ring     Past Surgical History:  Procedure Laterality Date  . COLONOSCOPY W/ BIOPSIES  02/2011 and 04/2016   colon polyps  . ESOPHAGOGASTRODUODENOSCOPY N/A 11/30/2014   Procedure: ESOPHAGOGASTRODUODENOSCOPY (EGD);  Surgeon: Scot Jun, MD;  Location: [facility] ENDOSCOPY;  Service: Endoscopy;  Laterality: N/A;  . SAVORY DILATION N/A 11/30/2014   Procedure: SAVORY DILATION;  Surgeon: Scot Jun, MD;  Location: [facility] ENDOSCOPY;  Service: Endoscopy;  Laterality: N/A;  . VULVA Ples Specter BIOPSY  02/2016    OB History    Gravida  1   Para  1   Term  1   Preterm      AB      Living  1     SAB      TAB      Ectopic      Multiple      Live Births  1             Home Medications    Prior to Admission medications   Medication Sig Start Date End Date Taking? Authorizing Provider  Cyanocobalamin (VITAMIN B 12 PO) Take 1,000 mcg by mouth daily.    Yes [provider]  loratadine (CLARITIN) 10 MG tablet Take 10 mg by mouth every morning.    Yes [provider]  azithromycin (ZITHROMAX) 250 MG tablet Take 1 tablet (250 mg total) by mouth daily. Take first 2 tablets together, then 1 every day until finished. 03/31/18   Lutricia Feil, PA-C  benzonatate (TESSALON) 200 MG capsule Take one cap TID PRN cough 03/31/18   Lutricia Feil, PA-C  chlorpheniramine-HYDROcodone [facility] ER) 10-8 MG/5ML SUER Take 5 mLs by mouth 2 (two) times daily. 03/31/18   Lutricia Feil, PA-C  DM-Doxylamine-Acetaminophen (NYQUIL COLD & FLU PO) Take 1 capsule by mouth as needed.    [provider]  Wheat Dextrin (BENEFIBER DRINK MIX PO) Take 1 Scoop by mouth daily.     [provider]  Family History Family History  Problem Relation Age of Onset  . Alzheimer's disease Mother        moved to [facility] in 2018  . Endometrial cancer Mother 24  . Cancer Mother   . Stroke Mother 40       deceased - massive brain hemorrhage that lead to a massive stroke  . Brain cancer Father 53  . Hypertension Father   . Hyperlipidemia Brother   . Stomach cancer Paternal Grandmother 22  . Melanoma Paternal Grandfather 110  . Alzheimer's disease Maternal Uncle        two maternal uncles  . Breast cancer Cousin 36       paternal half  cousin  . Colon cancer Cousin 50       paternal half cousin  . Heart disease Paternal Uncle     Social History Social History   Tobacco Use  . Smoking status: Never Smoker  . Smokeless tobacco: Never Used  Substance Use Topics  . Alcohol use: Yes    Comment: occasional  . Drug use: No     Allergies   Patient has no known allergies.   Review of Systems Review of Systems   Constitutional: Positive for activity change. Negative for appetite change, chills, fatigue and fever.  HENT: Positive for congestion.   Respiratory: Positive for cough. Negative for shortness of breath, wheezing and stridor.   All other systems reviewed and are negative.    Physical Exam Triage Vital Signs ED Triage Vitals  Enc Vitals Group     BP 03/31/18 0821 109/75     Pulse Rate 03/31/18 0821 68     Resp 03/31/18 0821 14     Temp 03/31/18 0821 98.2 F (36.8 C)     Temp Source 03/31/18 0821 Oral     SpO2 03/31/18 0821 100 %     Weight 03/31/18 0817 118 lb (53.5 kg)     Height 03/31/18 0817 4\' 11"  (1.499 m)     Head Circumference --      Peak Flow --      Pain Score 03/31/18 0817 0     Pain Loc --      Pain Edu? --      Excl. in GC? --    No data found.  Updated Vital Signs BP 109/75 (BP Location: Left Arm)   Pulse 68   Temp 98.2 F (36.8 C) (Oral)   Resp 14   Ht 4\' 11"  (1.499 m)   Wt 118 lb (53.5 kg)   SpO2 100%   BMI 23.83 kg/m   Visual Acuity Right Eye Distance:   Left Eye Distance:   Bilateral Distance:    Right Eye Near:   Left Eye Near:    Bilateral Near:     Physical Exam  Constitutional: She is oriented to person, place, and time. She appears well-developed and well-nourished. No distress.  HENT:  Head: Normocephalic.  Right Ear: External ear normal.  Left Ear: External ear normal.  Nose: Nose normal.  Mouth/Throat: Oropharynx is clear and moist. No oropharyngeal exudate.  Eyes: Pupils are equal, round, and reactive to light. Conjunctivae are normal. Right eye exhibits no discharge. Left eye exhibits no discharge.  Neck: Normal range of motion.  Pulmonary/Chest: Effort normal and breath sounds normal.  Musculoskeletal: Normal range of motion.  Lymphadenopathy:    She has no cervical adenopathy.  Neurological: She is alert and oriented to person, place, and time.  Skin: Skin is warm  and dry. She is not diaphoretic.  Psychiatric: She has a  normal mood and affect. Her behavior is normal. Judgment and thought content normal.  Nursing note and vitals reviewed.    UC Treatments / Results  Labs (all labs ordered are listed, but only abnormal results are displayed) Labs Reviewed - No data to display  EKG None  Radiology No results found.  Procedures Procedures (including critical care time)  Medications Ordered in UC Medications - No data to display  Initial Impression / Assessment and Plan / UC Course  I have reviewed the triage vital signs and the nursing notes.  Pertinent labs & imaging results that were available during my care of the patient were reviewed by me and considered in my medical decision making (see chart for details).   Has had the above symptoms for over a week and we will place her on azithromycin. Will also treat her cough with cough suppressants.If she  does not improve I have told her this could  be a viral illness.  Not improving she should follow-up with her primary care physician I will make a return to our clinic.      Final Clinical Impressions(s) / UC Diagnoses   Final diagnoses:  Upper respiratory tract infection, unspecified type   Discharge Instructions   None    ED Prescriptions    Medication Sig Dispense Auth. Provider   azithromycin (ZITHROMAX) 250 MG tablet Take 1 tablet (250 mg total) by mouth daily. Take first 2 tablets together, then 1 every day until finished. 6 tablet Lutricia FeilRoemer, [place] Mosco P, PA-C   benzonatate (TESSALON) 200 MG capsule Take one cap TID PRN cough 30 capsule Lutricia Feiloemer, Shali Vesey P, PA-C   chlorpheniramine-HYDROcodone (TUSSIONEX PENNKINETIC ER) 10-8 MG/5ML SUER Take 5 mLs by mouth 2 (two) times daily. 115 mL Lutricia Feiloemer, Ryna Beckstrom P, PA-C     Controlled Substance Prescriptions [place] Controlled Substance Registry consulted? Not Applicable   Lutricia FeilRoemer, Titilayo Hagans P, PA-C 03/31/18 1715

## 2018-04-06 ENCOUNTER — Other Ambulatory Visit: Payer: Self-pay

## 2018-04-06 ENCOUNTER — Ambulatory Visit: Payer: BLUE CROSS/BLUE SHIELD | Admitting: Anesthesiology

## 2018-04-06 ENCOUNTER — Encounter
Admission: RE | Disposition: A | Discharge status: Home / Self Care | Payer: Self-pay | Source: Ambulatory Visit | Attending: General Surgery

## 2018-04-06 ENCOUNTER — Ambulatory Visit
Admission: RE | Admit: 2018-04-06 | Discharge: 2018-04-06 | Disposition: A | Discharge status: Home / Self Care | Payer: BLUE CROSS/BLUE SHIELD | Source: Ambulatory Visit | Attending: General Surgery | Admitting: General Surgery

## 2018-04-06 ENCOUNTER — Encounter: Payer: Self-pay | Admitting: *Deleted

## 2018-04-06 DIAGNOSIS — E785 Hyperlipidemia, unspecified: Secondary | ICD-10-CM | POA: Insufficient documentation

## 2018-04-06 DIAGNOSIS — K644 Residual hemorrhoidal skin tags: Secondary | ICD-10-CM | POA: Insufficient documentation

## 2018-04-06 DIAGNOSIS — K643 Fourth degree hemorrhoids: Secondary | ICD-10-CM | POA: Diagnosis not present

## 2018-04-06 DIAGNOSIS — K648 Other hemorrhoids: Secondary | ICD-10-CM | POA: Insufficient documentation

## 2018-04-06 DIAGNOSIS — K449 Diaphragmatic hernia without obstruction or gangrene: Secondary | ICD-10-CM | POA: Insufficient documentation

## 2018-04-06 DIAGNOSIS — Z79899 Other long term (current) drug therapy: Secondary | ICD-10-CM | POA: Insufficient documentation

## 2018-04-06 HISTORY — PX: HEMORRHOID SURGERY: SHX153

## 2018-04-06 SURGERY — HEMORRHOIDECTOMY
Anesthesia: General | Site: Rectum

## 2018-04-06 MED ORDER — EPINEPHRINE 30 MG/30ML IJ SOLN
INTRAMUSCULAR | Status: AC
  Filled 2018-04-06: qty 1

## 2018-04-06 MED ORDER — ONDANSETRON HCL 4 MG/2ML IJ SOLN: 4.0000 mg | Freq: Once | INTRAMUSCULAR | Status: DC | PRN

## 2018-04-06 MED ORDER — PROPOFOL 10 MG/ML IV BOLUS
INTRAVENOUS | Status: AC
  Filled 2018-04-06: qty 20

## 2018-04-06 MED ORDER — FENTANYL CITRATE (PF) 100 MCG/2ML IJ SOLN
INTRAMUSCULAR | Status: AC
  Administered 2018-04-06: 25 ug via INTRAVENOUS
  Filled 2018-04-06: qty 2

## 2018-04-06 MED ORDER — DEXAMETHASONE SODIUM PHOSPHATE 10 MG/ML IJ SOLN
INTRAMUSCULAR | Status: DC | PRN
  Administered 2018-04-06: 10 mg via INTRAVENOUS

## 2018-04-06 MED ORDER — ROCURONIUM BROMIDE 50 MG/5ML IV SOLN
INTRAVENOUS | Status: AC
  Filled 2018-04-06: qty 1

## 2018-04-06 MED ORDER — BUPIVACAINE HCL 0.25 % IJ SOLN
INTRAMUSCULAR | Status: DC | PRN
  Administered 2018-04-06: 30 mL

## 2018-04-06 MED ORDER — FENTANYL CITRATE (PF) 100 MCG/2ML IJ SOLN
INTRAMUSCULAR | Status: DC | PRN
  Administered 2018-04-06: 50 ug via INTRAVENOUS
  Administered 2018-04-06 (×2): 25 ug via INTRAVENOUS

## 2018-04-06 MED ORDER — LIDOCAINE HCL (PF) 2 % IJ SOLN
INTRAMUSCULAR | Status: AC
  Filled 2018-04-06: qty 10

## 2018-04-06 MED ORDER — BUPIVACAINE HCL (PF) 0.5 % IJ SOLN
INTRAMUSCULAR | Status: AC
  Filled 2018-04-06: qty 30

## 2018-04-06 MED ORDER — GABAPENTIN 300 MG PO CAPS
300.0000 mg | ORAL_CAPSULE | ORAL | Status: AC
  Administered 2018-04-06: 300 mg via ORAL

## 2018-04-06 MED ORDER — FENTANYL CITRATE (PF) 100 MCG/2ML IJ SOLN
INTRAMUSCULAR | Status: AC
  Filled 2018-04-06: qty 2

## 2018-04-06 MED ORDER — BUPIVACAINE-EPINEPHRINE (PF) 0.25% -1:200000 IJ SOLN
INTRAMUSCULAR | Status: AC
  Filled 2018-04-06: qty 30

## 2018-04-06 MED ORDER — HYDROCODONE-ACETAMINOPHEN 5-325 MG PO TABS: 1.0000 | ORAL_TABLET | ORAL | 0 refills | Status: DC | PRN

## 2018-04-06 MED ORDER — HYDROMORPHONE HCL 1 MG/ML IJ SOLN
INTRAMUSCULAR | Status: AC
  Administered 2018-04-06: 0.5 mg via INTRAVENOUS
  Filled 2018-04-06: qty 1

## 2018-04-06 MED ORDER — EPINEPHRINE PF 1 MG/ML IJ SOLN
INTRAMUSCULAR | Status: AC
  Filled 2018-04-06: qty 1

## 2018-04-06 MED ORDER — MIDAZOLAM HCL 2 MG/2ML IJ SOLN
INTRAMUSCULAR | Status: DC | PRN
  Administered 2018-04-06 (×2): 1 mg via INTRAVENOUS

## 2018-04-06 MED ORDER — KETOROLAC TROMETHAMINE 30 MG/ML IJ SOLN
INTRAMUSCULAR | Status: AC
  Administered 2018-04-06: 30 mg via INTRAVENOUS
  Filled 2018-04-06: qty 1

## 2018-04-06 MED ORDER — FAMOTIDINE 20 MG PO TABS
20.0000 mg | ORAL_TABLET | Freq: Once | ORAL | Status: AC
  Administered 2018-04-06: 20 mg via ORAL

## 2018-04-06 MED ORDER — METRONIDAZOLE 500 MG PO TABS: 500.0000 mg | ORAL_TABLET | Freq: Three times a day (TID) | ORAL | 0 refills | Status: AC

## 2018-04-06 MED ORDER — DEXAMETHASONE SODIUM PHOSPHATE 10 MG/ML IJ SOLN
INTRAMUSCULAR | Status: AC
  Filled 2018-04-06: qty 1

## 2018-04-06 MED ORDER — BUPIVACAINE HCL (PF) 0.25 % IJ SOLN
INTRAMUSCULAR | Status: AC
  Filled 2018-04-06: qty 30

## 2018-04-06 MED ORDER — FENTANYL CITRATE (PF) 100 MCG/2ML IJ SOLN
25.0000 ug | INTRAMUSCULAR | Status: AC | PRN
  Administered 2018-04-06 (×6): 25 ug via INTRAVENOUS

## 2018-04-06 MED ORDER — LACTATED RINGERS IV SOLN
INTRAVENOUS | Status: DC
  Administered 2018-04-06 (×2): via INTRAVENOUS

## 2018-04-06 MED ORDER — METRONIDAZOLE IN NACL 5-0.79 MG/ML-% IV SOLN
500.0000 mg | INTRAVENOUS | Status: AC
  Administered 2018-04-06: 500 mg via INTRAVENOUS
  Filled 2018-04-06: qty 100

## 2018-04-06 MED ORDER — GABAPENTIN 300 MG PO CAPS
ORAL_CAPSULE | ORAL | Status: AC
  Filled 2018-04-06: qty 1

## 2018-04-06 MED ORDER — PROPOFOL 10 MG/ML IV BOLUS
INTRAVENOUS | Status: DC | PRN
  Administered 2018-04-06: 180 mg via INTRAVENOUS
  Administered 2018-04-06: 20 mg via INTRAVENOUS
  Administered 2018-04-06: 30 mg via INTRAVENOUS

## 2018-04-06 MED ORDER — BUPIVACAINE LIPOSOME 1.3 % IJ SUSP
INTRAMUSCULAR | Status: AC
  Filled 2018-04-06: qty 20

## 2018-04-06 MED ORDER — SUCCINYLCHOLINE CHLORIDE 20 MG/ML IJ SOLN
INTRAMUSCULAR | Status: AC
  Filled 2018-04-06: qty 1

## 2018-04-06 MED ORDER — KETOROLAC TROMETHAMINE 30 MG/ML IJ SOLN
30.0000 mg | Freq: Once | INTRAMUSCULAR | Status: AC
  Administered 2018-04-06: 30 mg via INTRAVENOUS
  Filled 2018-04-06: qty 1

## 2018-04-06 MED ORDER — MIDAZOLAM HCL 2 MG/2ML IJ SOLN
0.5000 mg | INTRAMUSCULAR | Status: AC
  Administered 2018-04-06: 0.5 mg via INTRAVENOUS

## 2018-04-06 MED ORDER — FAMOTIDINE 20 MG PO TABS
ORAL_TABLET | ORAL | Status: AC
  Filled 2018-04-06: qty 1

## 2018-04-06 MED ORDER — LIDOCAINE HCL (CARDIAC) PF 100 MG/5ML IV SOSY
PREFILLED_SYRINGE | INTRAVENOUS | Status: DC | PRN
  Administered 2018-04-06: 60 mg via INTRAVENOUS

## 2018-04-06 MED ORDER — ACETAMINOPHEN 10 MG/ML IV SOLN
INTRAVENOUS | Status: DC | PRN
  Administered 2018-04-06: 1000 mg via INTRAVENOUS

## 2018-04-06 MED ORDER — HYDROMORPHONE HCL 1 MG/ML IJ SOLN
0.5000 mg | INTRAMUSCULAR | Status: DC | PRN
  Administered 2018-04-06 (×2): 0.5 mg via INTRAVENOUS

## 2018-04-06 MED ORDER — BUPIVACAINE LIPOSOME 1.3 % IJ SUSP
INTRAMUSCULAR | Status: DC | PRN
  Administered 2018-04-06: 20 mL

## 2018-04-06 MED ORDER — MIDAZOLAM HCL 2 MG/2ML IJ SOLN
INTRAMUSCULAR | Status: AC
  Filled 2018-04-06: qty 2

## 2018-04-06 MED ORDER — ACETAMINOPHEN NICU IV SYRINGE 10 MG/ML
INTRAVENOUS | Status: AC
  Filled 2018-04-06: qty 1

## 2018-04-06 SURGICAL SUPPLY — 29 items
BLADE SURG 15 STRL SS SAFETY (BLADE) ×3 IMPLANT
BRIEF STRETCH MATERNITY 2XLG (MISCELLANEOUS) ×3 IMPLANT
CANISTER SUCT 1200ML W/VALVE (MISCELLANEOUS) ×3 IMPLANT
COVER WAND RF STERILE (DRAPES) ×3 IMPLANT
DRAPE LAPAROTOMY 100X77 ABD (DRAPES) ×3 IMPLANT
DRAPE LEGGINS SURG 28X43 STRL (DRAPES) ×3 IMPLANT
DRAPE UNDER BUTTOCK W/FLU (DRAPES) ×3 IMPLANT
DRSG GAUZE PETRO 6X36 STRIP ST (GAUZE/BANDAGES/DRESSINGS) ×3 IMPLANT
ELECT REM PT RETURN 9FT ADLT (ELECTROSURGICAL) ×3
ELECTRODE REM PT RTRN 9FT ADLT (ELECTROSURGICAL) ×1 IMPLANT
GAUZE PACKING 1/2X5YD (GAUZE/BANDAGES/DRESSINGS) ×3 IMPLANT
GLOVE BIO SURGEON STRL SZ7.5 (GLOVE) ×3 IMPLANT
GLOVE INDICATOR 8.0 STRL GRN (GLOVE) ×3 IMPLANT
GOWN STRL REUS W/ TWL LRG LVL3 (GOWN DISPOSABLE) ×2 IMPLANT
GOWN STRL REUS W/TWL LRG LVL3 (GOWN DISPOSABLE) ×4
KIT TURNOVER KIT A (KITS) ×3 IMPLANT
LABEL OR SOLS (LABEL) ×3 IMPLANT
NEEDLE HYPO 22GX1.5 SAFETY (NEEDLE) ×3 IMPLANT
NEEDLE HYPO 25X1 1.5 SAFETY (NEEDLE) ×3 IMPLANT
PACK BASIN MINOR ARMC (MISCELLANEOUS) ×3 IMPLANT
PAD OB MATERNITY 4.3X12.25 (PERSONAL CARE ITEMS) ×3 IMPLANT
PAD PREP 24X41 OB/GYN DISP (PERSONAL CARE ITEMS) ×3 IMPLANT
SHEARS FOC LG CVD HARMONIC 17C (MISCELLANEOUS) ×3 IMPLANT
SURGILUBE 2OZ TUBE FLIPTOP (MISCELLANEOUS) ×3 IMPLANT
SUT CHROMIC 3 0 SH 27 (SUTURE) ×3 IMPLANT
SUT SILK 0 CT 1 30 (SUTURE) ×3 IMPLANT
SUT VIC AB 3-0 SH 27 (SUTURE)
SUT VIC AB 3-0 SH 27X BRD (SUTURE) IMPLANT
SYR 10ML LL (SYRINGE) ×6 IMPLANT

## 2018-04-06 NOTE — OR Nursing (Signed)
Patient still crying rolling around stating pain is a 9 called Dr. Henrene HawkingKephart and got an order for some versed

## 2018-04-06 NOTE — Progress Notes (Signed)
Up to bathroom to void   Slight bloody drainage on peri pad

## 2018-04-06 NOTE — Anesthesia Postprocedure Evaluation (Signed)
Anesthesia Post Note  Patient: Patricia Franklin  Procedure(s) Performed: HEMORRHOIDECTOMY (N/A Rectum)  Patient location during evaluation: PACU Anesthesia Type: General Level of consciousness: awake and alert Pain management: pain level controlled Vital Signs Assessment: post-procedure vital signs reviewed and stable Respiratory status: spontaneous breathing and respiratory function stable Cardiovascular status: stable Anesthetic complications: no     Last Vitals:  Vitals:   04/06/18 1340 04/06/18 1413  BP: 137/74 133/73  Pulse: 96 92  Resp: 16   Temp: (!) 35.8 C   SpO2: 100% 100%    Last Pain:  Vitals:   04/06/18 1413  TempSrc:   PainSc: 5                  Christiann Hagerty K

## 2018-04-06 NOTE — Op Note (Addendum)
Preoperative diagnosis: Internal and external hemorrhoids.  Postoperative diagnosis: Same.  Operative procedure: Ferguson hemorrhoidectomy.  Operating Surgeon: Donnalee CurryJeffrey Karris Deangelo, MD  Anesthesia: General by LMA, Exparel: 20 cc; Marcaine 0.25%, plain, 30 cc.  Estimated blood loss: Less than 50 cc.  Clinical note: This 57 year old woman has severe symptomatic hemorrhoids unresponsive to conservative measures.  She was made for elective hemorrhoidectomy.  She received preoperative antibiotics.  Operative note: With the patient under adequate general anesthesia the patient was placed in dorsolithotomy position.  The perineum was cleansed with Betadine solution x2 and draped.  The above-mentioned mix of local anesthetics was infiltrated for postoperative analgesia.  Prominent hemorrhoids were noted at the 5, 7 and 10 o'clock position.  Significant internal and external component, digital exam showed normal anal diameter and sphincter tone. Transfixing sutures of 3-0 chromic were placed at the top of the 5:00 area and the skin divided with cautery.  The 4 mm polypoid lesion noted at the time of her exam earlier this year is no longer evident.  The remaining dissection was with the harmonic scalpel.  The internal sphincter was visualized and protected.  Hemostasis was with a harmonic scalpel.  The wound was closed with a running locking suture to the edge of the mucosal dermal junction.  Subcuticular sutures of 3-0 Vicryl were placed for skin closure.  The 7 and 10:00 lesions were treated in a similar fashion.  Good hemostasis was noted.  A Vaseline gauze pack was placed for removal in the recovery room.  Dry pad applied.  The patient was taken to recovery in stable condition.

## 2018-04-06 NOTE — H&P (Signed)
Patricia RamalVickie H Franklin [id number] 03-19-1961     HPI: Patient with symptomatic hemorrhoids for excision. Recent URI with cough. Resolved w/ Z pak.   Medications Prior to Admission  Medication Sig Dispense Refill Last Dose  . Cyanocobalamin (VITAMIN B 12 PO) Take 1,000 mcg by mouth daily.    Taking  . loratadine (CLARITIN) 10 MG tablet Take 10 mg by mouth every morning.    03/30/2018  . Wheat Dextrin (BENEFIBER DRINK MIX PO) Take 1 Scoop by mouth daily.    03/31/2018  . azithromycin (ZITHROMAX) 250 MG tablet Take 1 tablet (250 mg total) by mouth daily. Take first 2 tablets together, then 1 every day until finished. 6 tablet 0 04/04/2018  . benzonatate (TESSALON) 200 MG capsule Take one cap TID PRN cough 30 capsule 0 04/03/2018  . chlorpheniramine-HYDROcodone (TUSSIONEX PENNKINETIC ER) 10-8 MG/5ML SUER Take 5 mLs by mouth 2 (two) times daily. 115 mL 0 04/04/2018  . DM-Doxylamine-Acetaminophen (NYQUIL COLD & FLU PO) Take 1 capsule by mouth as needed.   03/30/2018   No Known Allergies Past Medical History:  Diagnosis Date  . Colon polyps   . Duodenitis   . Family history of colon cancer 11/2014   [facility]; letter sent  . Family history of kidney cancer   . Hiatal hernia   . Hyperlipidemia   . IBS (irritable bowel syndrome)   . Lichen sclerosus et atrophicus 2017  . Migraine   . Multiple gastric ulcers   . Schatzki's ring   . Seasonal allergies   . Shortness of breath    Past Surgical History:  Procedure Laterality Date  . COLONOSCOPY W/ BIOPSIES  02/2011 and 04/2016   colon polyps  . ESOPHAGOGASTRODUODENOSCOPY N/A 11/30/2014   Procedure: ESOPHAGOGASTRODUODENOSCOPY (EGD);  Surgeon: Scot Junobert T Elliott, MD;  [facility];  Service: Endoscopy;  Laterality: N/A;  . SAVORY DILATION N/A 11/30/2014   Procedure: SAVORY DILATION;  Surgeon: Scot Junobert T Elliott, MD;  [facility];  Service: Endoscopy;  Laterality: N/A;  . VULVA Ples Specter/PERINEUM BIOPSY   02/2016   Social History   Socioeconomic History  . Marital status: Married    Spouse name: Not on file  . Number of children: 1  . Years of education: 2216  . Highest education level: Not on file  Occupational History  . Occupation: Airline pilotAccountant    Comment: BS from [employer]  Social Needs  . Financial resource strain: Not on file  . Food insecurity:    Worry: Not on file    Inability: Not on file  . Transportation needs:    Medical: Not on file    Non-medical: Not on file  Tobacco Use  . Smoking status: Never Smoker  . Smokeless tobacco: Never Used  Substance and Sexual Activity  . Alcohol use: Yes    Comment: occasional  . Drug use: No  . Sexual activity: Not Currently    Partners: Male    Birth control/protection: Post-menopausal  Lifestyle  . Physical activity:    Days per week: Not on file    Minutes per session: Not on file  . Stress: Not on file  Relationships  . Social connections:    Talks on phone: Not on file    Gets together: Not on file    Attends religious service: Not on file    Active member of club or organization: Not on file    Attends meetings of clubs or organizations: Not on file    Relationship  status: Not on file  . Intimate partner violence:    Fear of current or ex partner: Not on file    Emotionally abused: Not on file    Physically abused: Not on file    Forced sexual activity: Not on file  Other Topics Concern  . Not on file  Social History Narrative  . Not on file   Social History   Social History Narrative  . Not on file     ROS: Negative.     PE: HEENT: Negative. Lungs: Clear. Cardio: RR.   Assessment/Plan:  Proceed with planned hemorrhoidectomy.  Patricia Franklin [facility] 04/06/2018

## 2018-04-06 NOTE — OR Nursing (Signed)
Dr. Lemar LivingsByrnett removed rectal packing and patient is rolling around on bed crying, Dr. Henrene HawkingKephart notified and he order dilaudid and Dr. Lemar LivingsByrnett order Toradol.

## 2018-04-06 NOTE — Progress Notes (Signed)
Dr. Byrnett into see 

## 2018-04-06 NOTE — OR Nursing (Signed)
Patient woke up calm, doesn't remember before, and does have some pain but not like before.  Proceeding to post op

## 2018-04-06 NOTE — Discharge Instructions (Signed)

## 2018-04-06 NOTE — Anesthesia Preprocedure Evaluation (Signed)
Anesthesia Evaluation  Patient identified by MRN, date of birth, ID band Patient awake    Reviewed: Allergy & Precautions, NPO status , Patient's Chart, lab work & pertinent test results  History of Anesthesia Complications Negative for: history of anesthetic complications  Airway Mallampati: II       Dental   Pulmonary neg sleep apnea, neg COPD,           Cardiovascular (-) hypertension(-) Past MI and (-) CHF (-) dysrhythmias (-) Valvular Problems/Murmurs     Neuro/Psych neg Seizures    GI/Hepatic Neg liver ROS, hiatal hernia, PUD, neg GERD  ,  Endo/Other  neg diabetes  Renal/GU negative Renal ROS     Musculoskeletal   Abdominal   Peds  Hematology   Anesthesia Other Findings   Reproductive/Obstetrics                             Anesthesia Physical Anesthesia Plan  ASA: II  Anesthesia Plan: General   Post-op Pain Management:    Induction: Intravenous  PONV Risk Score and Plan: 3 and Dexamethasone, Ondansetron and Midazolam  Airway Management Planned: LMA  Additional Equipment:   Intra-op Plan:   Post-operative Plan:   Informed Consent: I have reviewed the patients History and Physical, chart, labs and discussed the procedure including the risks, benefits and alternatives for the proposed anesthesia with the patient or authorized representative who has indicated his/her understanding and acceptance.     Plan Discussed with:   Anesthesia Plan Comments:         Anesthesia Quick Evaluation

## 2018-04-06 NOTE — Anesthesia Procedure Notes (Signed)
Procedure Name: LMA Insertion Date/Time: 04/06/2018 10:18 AM Performed by: Henrietta HooverPope, Takeyah Wieman, CRNA Pre-anesthesia Checklist: Patient identified, Patient being monitored, Timeout performed, Emergency Drugs available and Suction available Patient Re-evaluated:Patient Re-evaluated prior to induction Oxygen Delivery Method: Circle system utilized Preoxygenation: Pre-oxygenation with 100% oxygen Induction Type: IV induction Ventilation: Mask ventilation without difficulty LMA: LMA inserted LMA Size: 4.0 Tube type: Oral Number of attempts: 1 Placement Confirmation: positive ETCO2 and breath sounds checked- equal and bilateral Tube secured with: Tape Dental Injury: Teeth and Oropharynx as per pre-operative assessment  Difficulty Due To: Difficulty was unanticipated

## 2018-04-06 NOTE — Anesthesia Post-op Follow-up Note (Signed)
Anesthesia QCDR form completed.        

## 2018-04-06 NOTE — Transfer of Care (Signed)
Immediate Anesthesia Transfer of Care Note  Patient: Patricia Franklin  Procedure(s) Performed: HEMORRHOIDECTOMY (N/A Rectum)  Patient Location: PACU  Anesthesia Type:General  Level of Consciousness: sedated  Airway & Oxygen Therapy: Patient Spontanous Breathing and Patient connected to face mask oxygen  Post-op Assessment: Report given to RN and Post -op Vital signs reviewed and stable  Post vital signs: Reviewed and stable  Last Vitals:  Vitals Value Taken Time  BP 149/89 04/06/2018 11:33 AM  Temp 36.6 C 04/06/2018 11:33 AM  Pulse 88 04/06/2018 11:36 AM  Resp 15 04/06/2018 11:36 AM  SpO2 100 % 04/06/2018 11:36 AM  Vitals shown include unvalidated device data.  Last Pain:  Vitals:   04/06/18 1133  TempSrc:   PainSc: Asleep      Patients Stated Pain Goal: 3 (04/06/18 0840)  Complications: No apparent anesthesia complications

## 2018-04-07 LAB — SURGICAL PATHOLOGY

## 2018-04-07 NOTE — Addendum Note (Signed)
Addendum  created 04/07/18 0802 by Stormy Fabianurtis, Evoleth Nordmeyer, CRNA   Charge Capture section accepted

## 2018-04-08 ENCOUNTER — Telehealth: Payer: Self-pay | Admitting: *Deleted

## 2018-04-08 ENCOUNTER — Telehealth: Payer: Self-pay

## 2018-04-08 NOTE — Telephone Encounter (Signed)
Notified patient as instructed, patient pleased. Discussed follow-up appointments, patient agrees.She states she may start taking her pain medications today. Temp yesterday 99.6 today none.

## 2018-04-08 NOTE — Telephone Encounter (Signed)
-----   Message from Earline MayotteJeffrey W Byrnett, MD sent at 04/07/2018  9:39 PM EST ----- This patient had bad hemorrhoids removed on Monday, November 25.  She did have X. Burrell, hopefully still having good pain relief.  Letter know the pathology was fine.  Thank you ----- Message ----- From: Interface, Lab In Three Zero One Sent: 04/07/2018   9:01 PM EST To: Earline MayotteJeffrey W Byrnett, MD

## 2018-04-08 NOTE — Telephone Encounter (Signed)
Pt called after hour nurse stating she was returning a call from the office.  Pt declined to give phone # or msg.

## 2018-04-13 ENCOUNTER — Telehealth: Payer: Self-pay | Admitting: *Deleted

## 2018-04-13 NOTE — Telephone Encounter (Signed)
Patient states she had her first bowl movement last night and move her bowls this morning. She is taking Mirlax and benefiber daily to help with her constipation. She is using a heating pad. Still having some bleeding. She is going to do a sitz bath. The patient is aware to call back for any questions or concerns.

## 2018-04-13 NOTE — Telephone Encounter (Signed)
Patient called and stated that she had hemorrhoidectomy on 04/06/18 and she is still bleeding continually . She is in a lot of pain and only taking Tylenol. She stated that she had her 1st bowel movement last night and 2 this morning and it feels like she ripped something in that area. Please call and advise.

## 2018-04-14 ENCOUNTER — Ambulatory Visit
Admission: RE | Admit: 2018-04-14 | Discharge: 2018-04-14 | Disposition: A | Discharge status: Home / Self Care | Payer: BLUE CROSS/BLUE SHIELD | Source: Ambulatory Visit | Attending: Certified Nurse Midwife | Admitting: Certified Nurse Midwife

## 2018-04-14 ENCOUNTER — Encounter: Payer: Self-pay | Admitting: Certified Nurse Midwife

## 2018-04-14 DIAGNOSIS — Z1239 Encounter for other screening for malignant neoplasm of breast: Secondary | ICD-10-CM | POA: Diagnosis not present

## 2018-04-14 NOTE — Telephone Encounter (Signed)
Sent letter regarding lab results after calling and leaving message

## 2018-04-16 ENCOUNTER — Ambulatory Visit (INDEPENDENT_AMBULATORY_CARE_PROVIDER_SITE_OTHER): Payer: BLUE CROSS/BLUE SHIELD | Admitting: General Surgery

## 2018-04-16 ENCOUNTER — Other Ambulatory Visit: Payer: Self-pay

## 2018-04-16 ENCOUNTER — Encounter: Payer: Self-pay | Admitting: General Surgery

## 2018-04-16 VITALS — BP 120/79 | HR 79 | Temp 97.5°F | Resp 20 | Ht 59.0 in | Wt 116.0 lb

## 2018-04-16 DIAGNOSIS — K644 Residual hemorrhoidal skin tags: Secondary | ICD-10-CM

## 2018-04-16 DIAGNOSIS — K648 Other hemorrhoids: Secondary | ICD-10-CM

## 2018-04-16 NOTE — Progress Notes (Signed)
Patient ID: Patricia Franklin Flicker, female   DOB: [birth year], 57 y.o.   MRN: [id number]  Chief Complaint  Patient presents with  . Routine Post Op    HPI Patricia Franklin Plitt is a 57 y.o. female here today for her post op hemorrhoidectomy done on 04/06/2018. Painful when she moves her bowels.Stilling having some bleeding.  She has been using benefiber Miralax daily.   Past Medical History:  Diagnosis Date  . Colon polyps   . Duodenitis   . Family history of colon cancer 11/2014   [facility]; [facility] sent  . Family history of kidney cancer   . Hiatal hernia   . Hyperlipidemia   . IBS (irritable bowel syndrome)   . Lichen sclerosus et atrophicus 2017  . Migraine   . Multiple gastric ulcers   . Schatzki's ring   . Seasonal allergies   . Shortness of breath     Past Surgical History:  Procedure Laterality Date  . COLONOSCOPY W/ BIOPSIES  02/2011 and 04/2016   colon polyps  . ESOPHAGOGASTRODUODENOSCOPY N/A 11/30/2014   Procedure: ESOPHAGOGASTRODUODENOSCOPY (EGD);  Surgeon: Scot Junobert T Elliott, MD;  [facility];  Service: Endoscopy;  Laterality: N/A;  . HEMORRHOID SURGERY N/A 04/06/2018   Procedure: HEMORRHOIDECTOMY;  Surgeon: Earline MayotteByrnett, Tag Wurtz W, MD;  Location: [facility] ORS;  Service: General;  Laterality: N/A;  . SAVORY DILATION N/A 11/30/2014   Procedure: SAVORY DILATION;  Surgeon: Scot Junobert T Elliott, MD;  [facility];  Service: Endoscopy;  Laterality: N/A;  . VULVA /PERINEUM BIOPSY  02/2016    Family History  Problem Relation Age of Onset  . Alzheimer's disease Mother        [place] [facility] [place]  . Endometrial cancer Mother [id number]  . Cancer Mother   . Stroke Mother 7883       deceased - massive brain hemorrhage that lead to a massive stroke  . Brain cancer Father 6765  . Hypertension Father   . Hyperlipidemia Brother   . Stomach cancer Paternal Grandmother 450  . Melanoma Paternal Grandfather 5070  . Alzheimer's disease Maternal Uncle        two maternal uncles  . Colon cancer Cousin 50       paternal half cousin  . Heart disease Paternal Uncle   . Breast cancer Neg Hx     Social History Social History   Tobacco Use  . Smoking status: Never Smoker  . Smokeless tobacco: Never Used  Substance Use Topics  . Alcohol use: Yes    Comment: occasional  . Drug use: No    No Known Allergies  Current Outpatient Medications  Medication Sig Dispense Refill  . chlorpheniramine-HYDROcodone (TUSSIONEX PENNKINETIC ER) 10-8 MG/5ML SUER Take 5 mLs by mouth 2 (two) times daily. 115 mL 0  . cholecalciferol (VITAMIN D3) 25 MCG (1000 UT) tablet Take 1,000 Units by mouth daily.    . Cyanocobalamin (VITAMIN B 12 PO) Take 1,000 mcg by mouth daily.     Marland Kitchen. DM-Doxylamine-Acetaminophen (NYQUIL COLD & FLU PO) Take 1 capsule by mouth as needed.    . loratadine (CLARITIN) 10 MG tablet Take 10 mg by mouth every morning.     . Wheat Dextrin (BENEFIBER DRINK MIX PO) Take 1 Scoop by mouth daily.      No current facility-administered medications for this visit.     Review of Systems Review of Systems  Blood pressure 120/79, pulse 79, temperature (!) 97.5 F (36.4 C), temperature source  Skin, resp. rate 20, height 4\' 11"  (1.499 m), weight 116 lb (52.6 kg), SpO2 98 %.  Physical Exam Physical Exam  Constitutional: She is oriented to person, place, and time. She appears well-developed.  Genitourinary:     Neurological: She is alert and oriented to person, place, and time.  Skin: Skin is warm and dry.    Data Reviewed DIAGNOSIS:  A. ANORECTUM; HEMORRHOIDECTOMY:  - ONE HEMORRHOID SURFACED BY SQUAMOUS AND COLUMNAR EPITHELIUM.  - TWO SMALLER HEMORRHOIDS SURFACED BY SQUAMOUS EPITHELIUM.  - NEGATIVE FOR DYSPLASIA AND MALIGNANCY.   Assessment    Doing well post open hemorrhoidectomy.    Plan    The patient was reporting moderately loose stools making use of a full capful of MiraLAX, she has been asked to modify this by using a half  capful of miralax daily .  Return in one month.The patient is aware to call back for any questions or concerns.   HPI, Physical Exam, Assessment and Plan have been scribed under the direction and in the presence of Donnalee Curry, MD.  Ples Specter, CMA  I have completed the exam and reviewed the above documentation for accuracy and completeness.  I agree with the above.  Museum/gallery conservator has been used and any errors in dictation or transcription are unintentional.  Donnalee Curry, M.D., F.A.C.S.  Merrily Pew Cecil Bixby 04/16/2018, 8:04 PM

## 2018-04-16 NOTE — Patient Instructions (Signed)
  Try using a half of miralax daily . Return in one month.The patient is aware to call back for any questions or concerns.

## 2018-05-19 ENCOUNTER — Ambulatory Visit: Payer: BLUE CROSS/BLUE SHIELD | Admitting: General Surgery

## 2018-05-25 ENCOUNTER — Telehealth: Payer: Self-pay | Admitting: General Surgery

## 2018-05-25 NOTE — Telephone Encounter (Signed)
Spoke with the patients letting her know we would need to r/s her appointment with Dr. Byrnett on 06/02/2018. Patient stated she did not want to see another provider and would like to see Dr. Byrnett I did let the patient know we were not sure of a return date as of right now and would call her back to let her know. °

## 2018-06-02 ENCOUNTER — Ambulatory Visit: Payer: BLUE CROSS/BLUE SHIELD | Admitting: General Surgery

## 2018-07-14 ENCOUNTER — Other Ambulatory Visit: Payer: Self-pay

## 2018-07-14 ENCOUNTER — Ambulatory Visit: Payer: PRIVATE HEALTH INSURANCE | Admitting: General Surgery

## 2018-07-14 ENCOUNTER — Encounter: Payer: Self-pay | Admitting: General Surgery

## 2018-07-14 VITALS — BP 122/80 | HR 90 | Temp 97.7°F | Ht 59.0 in | Wt 116.0 lb

## 2018-07-14 DIAGNOSIS — K648 Other hemorrhoids: Secondary | ICD-10-CM

## 2018-07-14 DIAGNOSIS — K644 Residual hemorrhoidal skin tags: Secondary | ICD-10-CM

## 2018-07-14 NOTE — Patient Instructions (Addendum)
The patient is aware to call back for any questions or new concerns. continue fiber supplements may use glycerin suppositories as needed prior to BM.

## 2018-07-14 NOTE — Progress Notes (Signed)
Patient ID: Patricia Franklin, female   DOB: Oct 07, 1960, 58 y.o.   MRN: [id number]  Chief Complaint  Patient presents with  . Routine Post Op    one month f/u s/p hemorrhoidectomy done 04-06-18    HPI Patricia Franklin is a 58 y.o. female.  Here for postoperative visit, hemorrhoidectomy 04-06-18. She states she is doing well. Occasional bleeding with BM, noticed this maybe 3 times since last visit 3 months ago At times she has a sense that she needs to defecate but may have trouble initiating her stools.Marland Kitchen HPI  Past Medical History:  Diagnosis Date  . Colon polyps   . Duodenitis   . Family history of colon cancer 11/2014   qualifies for colaris [facility] genetic testing; letter sent  . Family history of kidney cancer   . Hiatal hernia   . Hyperlipidemia   . IBS (irritable bowel syndrome)   . Lichen sclerosus et atrophicus 2017  . Migraine   . Multiple gastric ulcers   . Schatzki's ring   . Seasonal allergies   . Shortness of breath     Past Surgical History:  Procedure Laterality Date  . COLONOSCOPY W/ BIOPSIES  02/2011 and 04/2016   colon polyps  . ESOPHAGOGASTRODUODENOSCOPY N/A 11/30/2014   Procedure: ESOPHAGOGASTRODUODENOSCOPY (EGD);  Surgeon: Scot Jun, MD;  [facility] ENDOSCOPY;  Service: Endoscopy;  Laterality: N/A;  . HEMORRHOID SURGERY N/A 04/06/2018   Procedure: HEMORRHOIDECTOMY;  Surgeon: Earline Mayotte, MD;  Location: [facility] ORS;  Service: General;  Laterality: N/A;  . SAVORY DILATION N/A 11/30/2014   Procedure: SAVORY DILATION;  Surgeon: Scot Jun, MD;  Location: [facility] ENDOSCOPY;  Service: Endoscopy;  Laterality: N/A;  . VULVA /PERINEUM BIOPSY  02/2016    Family History  Problem Relation Age of Onset  . Alzheimer's disease Mother        moved to [facility] in 2018  . Endometrial cancer Mother 44  . Cancer Mother   . Stroke Mother 46       deceased - massive brain hemorrhage that lead to a massive stroke  . Brain cancer Father 78  . Hypertension  Father   . Hyperlipidemia Brother   . Stomach cancer Paternal Grandmother 58  . Melanoma Paternal Grandfather 40  . Alzheimer's disease Maternal Uncle        two maternal uncles  . Colon cancer Cousin 50       paternal half cousin  . Heart disease Paternal Uncle   . Breast cancer Neg Hx     Social History Social History   Tobacco Use  . Smoking status: Never Smoker  . Smokeless tobacco: Never Used  Substance Use Topics  . Alcohol use: Yes    Comment: occasional  . Drug use: No    No Known Allergies  Current Outpatient Medications  Medication Sig Dispense Refill  . Calcium Carbonate-Vit D-Min (CALTRATE 600+D PLUS PO) Take by mouth.    . cholecalciferol (VITAMIN D3) 25 MCG (1000 UT) tablet Take 1,000 Units by mouth daily.    . Cyanocobalamin (VITAMIN B 12 PO) Take 1,000 mcg by mouth daily.     Marland Kitchen loratadine (CLARITIN) 10 MG tablet Take 10 mg by mouth every morning.     . Wheat Dextrin (BENEFIBER DRINK MIX PO) Take 1 Scoop by mouth daily.      No current facility-administered medications for this visit.     Review of Systems Review of Systems  Constitutional: Negative.   Respiratory: Negative.  Cardiovascular: Negative.   Gastrointestinal: Negative for constipation and diarrhea.    Blood pressure 122/80, pulse 90, temperature 97.7 F (36.5 C), temperature source Temporal, height 4\' 11"  (1.499 m), weight 116 lb (52.6 kg), SpO2 99 %.  Physical Exam Physical Exam Constitutional:      Appearance: Normal appearance.  Genitourinary:    Comments: Anal skin tags noted Skin:    General: Skin is warm and dry.  Neurological:     Mental Status: She is alert and oriented to person, place, and time.  Psychiatric:        Mood and Affect: Mood normal.        Behavior: Behavior normal.   External anal exam shows mild redundant skin.  No induration or thickening.  Data Reviewed April 06, 2018 pathology: A. ANORECTUM; HEMORRHOIDECTOMY:  - ONE HEMORRHOID SURFACED BY  SQUAMOUS AND COLUMNAR EPITHELIUM.  - TWO SMALLER HEMORRHOIDS SURFACED BY SQUAMOUS EPITHELIUM.  - NEGATIVE FOR DYSPLASIA AND MALIGNANCY.   Assessment Doing well post hemorrhoidectomy.  Plan  Continue fiber supplements, may use glycerin suppositories as needed prior to BM. The patient is aware to call back for any questions or new concerns. Follow up as needed.  HPI, assessment, plan and physical exam has been scribed under the direction and in the presence of Earline Mayotte, MD. Dorathy Daft, RN  I have completed the exam and reviewed the above documentation for accuracy and completeness.  I agree with the above.  Museum/gallery conservator has been used and any errors in dictation or transcription are unintentional.  Donnalee Curry, M.D., F.A.C.S.  Merrily Pew Yassine Brunsman 07/14/2018, 8:23 PM

## 2018-11-12 ENCOUNTER — Telehealth: Payer: Self-pay | Admitting: *Deleted

## 2018-11-12 ENCOUNTER — Other Ambulatory Visit: Payer: Self-pay

## 2018-11-12 DIAGNOSIS — Z20822 Contact with and (suspected) exposure to covid-19: Secondary | ICD-10-CM

## 2018-11-12 NOTE — Telephone Encounter (Signed)
Spoke with [facility], who referred pt for COVID-19 testing though [facility] Department.  Phone: [phone] Fax: [phone]  Pt called and scheduled for testing at [facility] site on 11/12/18. Pt advised to wear a mask and to remain in car at testing time. Pt verbalized understanding.

## 2018-11-14 LAB — NOVEL CORONAVIRUS, NAA: SARS-CoV-2, NAA: NOT DETECTED

## 2019-03-26 ENCOUNTER — Other Ambulatory Visit (HOSPITAL_COMMUNITY)
Admission: RE | Admit: 2019-03-26 | Discharge: 2019-03-26 | Disposition: A | Discharge status: Home / Self Care | Payer: PRIVATE HEALTH INSURANCE | Source: Ambulatory Visit | Attending: Certified Nurse Midwife | Admitting: Certified Nurse Midwife

## 2019-03-26 ENCOUNTER — Other Ambulatory Visit: Payer: Self-pay

## 2019-03-26 ENCOUNTER — Ambulatory Visit (INDEPENDENT_AMBULATORY_CARE_PROVIDER_SITE_OTHER): Payer: PRIVATE HEALTH INSURANCE | Admitting: Certified Nurse Midwife

## 2019-03-26 ENCOUNTER — Encounter: Payer: Self-pay | Admitting: Certified Nurse Midwife

## 2019-03-26 VITALS — BP 104/70 | HR 69 | Ht 59.75 in | Wt 118.0 lb

## 2019-03-26 DIAGNOSIS — Z124 Encounter for screening for malignant neoplasm of cervix: Secondary | ICD-10-CM | POA: Insufficient documentation

## 2019-03-26 DIAGNOSIS — Z01419 Encounter for gynecological examination (general) (routine) without abnormal findings: Secondary | ICD-10-CM | POA: Diagnosis not present

## 2019-03-26 DIAGNOSIS — Z23 Encounter for immunization: Secondary | ICD-10-CM

## 2019-03-26 DIAGNOSIS — E785 Hyperlipidemia, unspecified: Secondary | ICD-10-CM

## 2019-03-26 DIAGNOSIS — Z1231 Encounter for screening mammogram for malignant neoplasm of breast: Secondary | ICD-10-CM

## 2019-03-26 DIAGNOSIS — Z131 Encounter for screening for diabetes mellitus: Secondary | ICD-10-CM

## 2019-03-26 MED ORDER — VALACYCLOVIR HCL 1 G PO TABS: ORAL_TABLET | ORAL | 0 refills | Status: DC

## 2019-03-26 MED ORDER — ZOSTER VAC RECOMB ADJUVANTED 50 MCG/0.5ML IM SUSR: INTRAMUSCULAR | 0 refills | Status: AC

## 2019-03-26 MED ORDER — HYDROXYZINE HCL 25 MG PO TABS: 25.0000 mg | ORAL_TABLET | Freq: Four times a day (QID) | ORAL | 1 refills | Status: DC | PRN

## 2019-03-26 NOTE — Progress Notes (Signed)
Gynecology Annual Exam  PCP: System, Provider Not In  Chief Complaint:  Chief Complaint  Patient presents with  . Gynecologic Exam    what shots does she need; is fasting in case of blood work; anything otc for anxiety,    History of Present Illness:Patricia Franklin presents today for her annual exam. She is a 58 year old Caucasian/White postmenopausal female , G 1 P 1 0 0 1 , whose LMP was about 9-10 years ago . She is having no significant GYN problems. Has been experiencing some anxiety recently, mostly when driving in the car long distances with her husband. She has also had a cold sore outbreak recently that spread from her mouth to her upper lip, she thinks because of wearing a mask, that did not respond to Abreva.   She has had no spotting and her hot flashes are infrequent. She is not currently sexually active.  The patient's past medical history is notable for a history of hiatal hernia, duodenitis, gastric ulcers, mild hyperlipidemia, migraine headaches, lichen sclerosis et atrophicus, seasonal allergies and she has a Schatzki's ring.. She uses clobetasol occasionally for lichen symptoms. After her last annual GYN exam dated 03/24/2018, she had a hemorrhoidectomy.   Her most recent pap smear was obtained 03/24/2018 and was NIL. Her most recent mammogram obtained on 04/14/2018 was normal and revealed no significant changes. There is no family history of breast cancer. There is no family history of ovarian cancer. The patient does do monthly self breast exams.  She had a colonoscopy in 04/18/2016 that revealed an adenomatous polyp . Her next colonoscopy is due in 5 years. She had a recent DEXA scan obtained in 12/01/2012 that was normal.  The patient does not smoke.  The patient does have one alcohol drink/week. The patient does not use illegal drugs.  The patient exercises by walking a mile a day. The patient does not get adequate calcium in her diet due to some lactose  intolerance issues, and stopped taking calcium supplements.  She had a recent cholesterol screen in 2018 that was borderline (Total 218, HDL 60, LDL 141, tri 84).    The patient denies current symptoms of depression.    Review of Systems: Review of Systems  Constitutional: Negative for chills, fever, malaise/fatigue and weight loss.  HENT: Negative for congestion, sinus pain and sore throat.   Eyes: Negative for blurred vision and pain.  Respiratory: Positive for shortness of breath (from allergies/congestion/wearing mask). Negative for hemoptysis and wheezing.   Cardiovascular: Positive for chest pain (intermittently). Negative for palpitations and leg swelling.  Gastrointestinal: Negative for abdominal pain, constipation, diarrhea, heartburn, nausea and vomiting.       Positive for hemorrhoidal pain  Genitourinary: Negative for dysuria, frequency, hematuria and urgency.  Musculoskeletal: Negative for back pain, joint pain and myalgias.  Skin: Negative for itching and rash.       Cold sores  Neurological: Negative for dizziness, tingling and headaches.  Endo/Heme/Allergies: Positive for environmental allergies. Negative for polydipsia. Does not bruise/bleed easily.       Negative for hirsutism   Psychiatric/Behavioral: Negative for depression. The patient is nervous/anxious. The patient does not have insomnia.     Past Medical History:  Past Medical History:  Diagnosis Date  . Colon polyps   . Duodenitis   . Family history of colon cancer 11/2014   [facility]; [facility] sent  . Family history of kidney cancer   .  Hemorrhoids   . Hiatal hernia   . Hyperlipidemia   . IBS (irritable bowel syndrome)   . Lichen sclerosus et atrophicus 2017  . Migraine   . Multiple gastric ulcers   . Schatzki's ring   . Seasonal allergies   . Shortness of breath     Past Surgical History:  Past Surgical History:  Procedure Laterality Date  . COLONOSCOPY W/  BIOPSIES  02/2011 and 04/2016   colon polyps  . ESOPHAGOGASTRODUODENOSCOPY N/A 11/30/2014   Procedure: ESOPHAGOGASTRODUODENOSCOPY (EGD);  Surgeon: Scot Jun, MD;  Location: [facility] ENDOSCOPY;  Service: Endoscopy;  Laterality: N/A;  . HEMORRHOID SURGERY N/A 04/06/2018   Procedure: HEMORRHOIDECTOMY;  Surgeon: Earline Mayotte, MD;  Location: [facility] ORS;  Service: General;  Laterality: N/A;  . SAVORY DILATION N/A 11/30/2014   Procedure: SAVORY DILATION;  Surgeon: Scot Jun, MD;  Location: [facility] ENDOSCOPY;  Service: Endoscopy;  Laterality: N/A;  . VULVA Ples Specter BIOPSY  02/2016    Family History:  Family History  Problem Relation Age of Onset  . Alzheimer's disease Mother        moved to [facility] in 2018  . Endometrial cancer Mother 63  . Cancer Mother   . Stroke Mother 16       deceased - massive brain hemorrhage that lead to a massive stroke  . Brain cancer Father 77  . Hypertension Father   . Cancer Father        kidney, lung and brain  . Hyperlipidemia Brother   . Stomach cancer Paternal Grandmother 15  . Melanoma Paternal Grandfather 50  . Alzheimer's disease Maternal Uncle        two maternal uncles  . Colon cancer Cousin 50       paternal half cousin  . Heart disease Paternal Uncle   . Breast cancer Neg Hx     Social History:  Social History   Socioeconomic History  . Marital status: Married    Spouse name: Not on file  . Number of children: 1  . Years of education: 62  . Highest education level: Not on file  Occupational History  . Occupation: Airline pilot    Comment: BS from [employer]  . Financial resource strain: Not on file  . Food insecurity    Worry: Not on file    Inability: Not on file  . Transportation needs    Medical: Not on file    Non-medical: Not on file  Tobacco Use  . Smoking status: Never Smoker  . Smokeless tobacco: Never Used  Substance and Sexual Activity  . Alcohol use: Yes    Comment: occasional  . Drug use: No   . Sexual activity: Not Currently    Partners: Male    Birth control/protection: Post-menopausal  Lifestyle  . Physical activity    Days per week: Not on file    Minutes per session: Not on file  . Stress: Not on file  Relationships  . Social Musician on phone: Not on file    Gets together: Not on file    Attends religious service: Not on file    Active member of club or organization: Not on file    Attends meetings of clubs or organizations: Not on file    Relationship status: Not on file  . Intimate partner violence    Fear of current or ex partner: Not on file    Emotionally abused: Not on file  Physically abused: Not on file    Forced sexual activity: Not on file  Other Topics Concern  . Not on file  Social History Narrative  . Not on file    Allergies:  No Known Allergies  Medications:  Current Outpatient Medications:  .  Calcium Carbonate-Vit D-Min (CALTRATE 600+D PLUS PO), Take by mouth., Disp: , Rfl:  .  cholecalciferol (VITAMIN D3) 25 MCG (1000 UT) tablet, Take 1,000 Units by mouth daily., Disp: , Rfl:  .  Cyanocobalamin (VITAMIN B 12 PO), Take 1,000 mcg by mouth daily. , Disp: , Rfl:  .  loratadine (CLARITIN) 10 MG tablet, Take 10 mg by mouth every morning. , Disp: , Rfl:  .  Omega-3 Fatty Acids (FISH OIL) 500 MG CAPS, Take 1 capsule by mouth daily. Four days a week., Disp: , Rfl:  .  vitamin E 100 UNIT capsule, Take 100 Units by mouth daily., Disp: , Rfl:  .  Wheat Dextrin (BENEFIBER DRINK MIX PO), Take 1 Scoop by mouth daily. , Disp: , Rfl:  Physical Exam Vitals: BP 104/70   Pulse 69   Ht 4' 11.75" (1.518 m)   Wt 118 lb (53.5 kg)   BMI 23.24 kg/m   General: WF in NAD HEENT: normocephalic, anicteric Neck: no thyroid enlargement, no palpable nodules, no cervical lymphadenopathy  Pulmonary: No increased work of breathing, CTAB Cardiovascular: RRR, without murmur  Breast: Breast symmetrical, no tenderness, no palpable nodules or masses, no  skin or nipple retraction present, no nipple discharge.  No axillary, infraclavicular or supraclavicular lymphadenopathy. Abdomen: Soft, non-tender, non-distended.  Umbilicus without lesions.  No hepatomegaly or masses palpable. No evidence of hernia. Genitourinary:  External: Atrophic changes, no areas of whitening noted  Vagina: atrophic vaginal mucosa, no evidence of prolapse,    Cervix: Grossly normal in appearance, no bleeding, non-tender  Uterus: Anteverted, small, normal shape and consistency, mobile, and non-tender  Adnexa: No adnexal masses, non-tender  Rectal: external hemorrhoid noted, no bleeding  Lymphatic: no evidence of inguinal lymphadenopathy Extremities: no edema, erythema, or tenderness Neurologic: Grossly intact Psychiatric: mood appropriate, affect full     Assessment: 58 y.o. annual gyn exam Herpes labialis  Valtrex 2 GM every 12 hour x 2 doses at onset of cold sores #30/RF x 1 Situational anxiety   Trial of Atarax 25 mgm q6 hours prn #30/RFx1 Plan:    1) Breast cancer screening - recommend monthly self breast exam and annual screening mammograms.   Mammogram ordered and patient to schedule  2) Colon cancer screening: UTD.   3) Cervical cancer screening - Pap was done.   Patient opts for yearly screening interval  4) Routine healthcare maintenance: Repeat fasting lipid panel done today, hemoglobin A1C also Immunizations: TDAP due and given today. RX for Shingrix  5) RTO in 1 year and prn.  Dalia Heading, CNM

## 2019-03-27 LAB — LIPID PANEL
Chol/HDL Ratio: 3.7 ratio (ref 0.0–4.4)
Cholesterol, Total: 244 mg/dL — ABNORMAL HIGH (ref 100–199)
HDL: 66 mg/dL (ref >39)
LDL Chol Calc (NIH): 155 mg/dL — ABNORMAL HIGH (ref 0–99)
Triglycerides: 129 mg/dL (ref 0–149)
VLDL Cholesterol Cal: 23 mg/dL (ref 5–40)

## 2019-03-27 LAB — HEMOGLOBIN A1C
Est. average glucose Bld gHb Est-mCnc: 105 mg/dL
Hgb A1c MFr Bld: 5.3 % (ref 4.8–5.6)

## 2019-03-30 LAB — CYTOLOGY - PAP: Diagnosis: NEGATIVE

## 2019-04-14 ENCOUNTER — Telehealth: Payer: Self-pay

## 2019-04-14 NOTE — Telephone Encounter (Signed)
Patient called and labs discussed.

## 2019-04-14 NOTE — Telephone Encounter (Signed)
Patient would like to discuss lab results w/[facility] at her convenience. Cb#[phone]

## 2019-04-19 ENCOUNTER — Ambulatory Visit: Payer: Self-pay

## 2019-04-23 ENCOUNTER — Other Ambulatory Visit: Payer: Self-pay

## 2019-04-23 ENCOUNTER — Ambulatory Visit
Admission: RE | Admit: 2019-04-23 | Discharge: 2019-04-23 | Disposition: A | Discharge status: Home / Self Care | Payer: PRIVATE HEALTH INSURANCE | Source: Ambulatory Visit | Attending: Certified Nurse Midwife | Admitting: Certified Nurse Midwife

## 2019-04-23 DIAGNOSIS — Z1231 Encounter for screening mammogram for malignant neoplasm of breast: Secondary | ICD-10-CM | POA: Diagnosis not present

## 2020-03-27 NOTE — Progress Notes (Signed)
PCP: System, Provider Not In   Chief Complaint  Patient presents with  . Gynecologic Exam    HPI:      Patricia Franklin is a 59 y.o. G1P1001 whose LMP was No LMP recorded. Patient is postmenopausal., presents today for her annual examination.  Her menses are absent due to menopause. No PMB. She does not have vasomotor sx.   Sex activity: not sexually active. She does not have vaginal dryness.  Last Pap: 03/26/19  Results were: no abnormalities /neg HPV DNA 2018. No hx of abn paps with bx/tx.  Last mammogram: 04/23/19  Results were: normal--routine follow-up in 12 months There is no FH of breast cancer. There is no FH of ovarian cancer. The patient does do self-breast exams.  Colonoscopy: 2017 with polyp; Repeat due after 5 years.   Tobacco use: The patient denies current or previous tobacco use. Alcohol use: social drinker  No drug use Exercise: moderately active  She does get adequate calcium and Vitamin D in her diet.  Borderline lipids last yr. Due for repeat this yr  Has had humming in bilat ears for at least a few months. No nasal congestion/lightheadedness/headaches. No hx of loud noise exposures. Has seen ENT in past for different issue   Past Medical History:  Diagnosis Date  . Colon polyps   . Duodenitis   . Family history of colon cancer   . Family history of kidney cancer   . Hemorrhoids   . Hiatal hernia   . Hyperlipidemia   . IBS (irritable bowel syndrome)   . Lichen sclerosus et atrophicus 2017  . Migraine   . Multiple gastric ulcers   . Schatzki's ring   . Seasonal allergies   . Shortness of breath     Past Surgical History:  Procedure Laterality Date  . COLONOSCOPY W/ BIOPSIES  02/2011 and 04/2016   colon polyps  . ESOPHAGOGASTRODUODENOSCOPY N/A 11/30/2014   Procedure: ESOPHAGOGASTRODUODENOSCOPY (EGD);  Surgeon: Scot Jun, MD;  Location: [facility] ENDOSCOPY;  Service: Endoscopy;  Laterality: N/A;  . HEMORRHOID SURGERY N/A 04/06/2018    Procedure: HEMORRHOIDECTOMY;  Surgeon: Earline Mayotte, MD;  Location: [facility] ORS;  Service: General;  Laterality: N/A;  . SAVORY DILATION N/A 11/30/2014   Procedure: SAVORY DILATION;  Surgeon: Scot Jun, MD;  Location: [facility] ENDOSCOPY;  Service: Endoscopy;  Laterality: N/A;  . VULVA /PERINEUM BIOPSY  02/2016    Family History  Problem Relation Age of Onset  . Alzheimer's disease Mother        moved to [facility] in 2018  . Endometrial cancer Mother 31  . Cancer Mother   . Stroke Mother 96       deceased - massive brain hemorrhage that lead to a massive stroke  . Brain cancer Father 12  . Hypertension Father   . Cancer Father        kidney, lung and brain  . Hyperlipidemia Brother   . Stomach cancer Paternal Grandmother 68  . Melanoma Paternal Grandfather 70  . Alzheimer's disease Maternal Uncle        two maternal uncles  . Colon cancer Cousin 50       paternal half cousin  . Heart disease Paternal Uncle   . Breast cancer Neg Hx     Social History   Socioeconomic History  . Marital status: Married    Spouse name: Not on file  . Number of children: 1  . Years of education: 64  .  Highest education level: Not on file  Occupational History  . Occupation: Accountant    Comment: BS from [employer]  Tobacco Use  . Smoking status: Never Smoker  . Smokeless tobacco: Never Used  Vaping Use  . Vaping Use: Never used  Substance and Sexual Activity  . Alcohol use: Yes    Comment: occasional  . Drug use: No  . Sexual activity: Not Currently    Partners: Male    Birth control/protection: Post-menopausal  Other Topics Concern  . Not on file  Social History Narrative  . Not on file   Social Determinants of Health   Financial Resource Strain:   . Difficulty of Paying Living Expenses: Not on file  Food Insecurity:   . Worried About Programme researcher, broadcasting/film/video in the Last Year: Not on file  . Ran Out of Food in the Last Year: Not on file  Transportation Needs:   . Lack of  Transportation (Medical): Not on file  . Lack of Transportation (Non-Medical): Not on file  Physical Activity:   . Days of Exercise per Week: Not on file  . Minutes of Exercise per Session: Not on file  Stress:   . Feeling of Stress : Not on file  Social Connections:   . Frequency of Communication with Friends and Family: Not on file  . Frequency of Social Gatherings with Friends and Family: Not on file  . Attends Religious Services: Not on file  . Active Member of Clubs or Organizations: Not on file  . Attends Banker Meetings: Not on file  . Marital Status: Not on file  Intimate Partner Violence:   . Fear of Current or Ex-Partner: Not on file  . Emotionally Abused: Not on file  . Physically Abused: Not on file  . Sexually Abused: Not on file     Current Outpatient Medications:  .  Calcium Carbonate-Vit D-Min (CALTRATE 600+D PLUS PO), Take by mouth., Disp: , Rfl:  .  cholecalciferol (VITAMIN D3) 25 MCG (1000 UT) tablet, Take 1,000 Units by mouth daily., Disp: , Rfl:  .  Cyanocobalamin (VITAMIN B 12 PO), Take 1,000 mcg by mouth daily. , Disp: , Rfl:  .  loratadine (CLARITIN) 10 MG tablet, Take 10 mg by mouth every morning. , Disp: , Rfl:  .  Omega-3 Fatty Acids (FISH OIL) 500 MG CAPS, Take 1 capsule by mouth daily. Four days a week., Disp: , Rfl:  .  valACYclovir (VALTREX) 1000 MG tablet, Take 2 tablets every 12 hours x 2 doses at onset of cold sores, Disp: 30 tablet, Rfl: 0 .  Wheat Dextrin (BENEFIBER DRINK MIX PO), Take 1 Scoop by mouth daily. , Disp: , Rfl:  .  hydrOXYzine (ATARAX/VISTARIL) 25 MG tablet, Take 1 tablet (25 mg total) by mouth every 6 (six) hours as needed for anxiety. (Patient not taking: Reported on 03/28/2020), Disp: 30 tablet, Rfl: 1 .  vitamin E 100 UNIT capsule, Take 100 Units by mouth daily. (Patient not taking: Reported on 03/28/2020), Disp: , Rfl:  .  Zoster Vaccine Adjuvanted [facility]) injection, Inject 0.5 ml IM now and repeat another in 2-6  months. (Patient not taking: Reported on 03/28/2020), Disp: 0.5 mL, Rfl: 0     ROS:  Review of Systems  Constitutional: Negative for fatigue, fever and unexpected weight change.  Respiratory: Negative for cough, shortness of breath and wheezing.   Cardiovascular: Negative for chest pain, palpitations and leg swelling.  Gastrointestinal: Negative for blood in stool, constipation, diarrhea, nausea  and vomiting.  Endocrine: Negative for cold intolerance, heat intolerance and polyuria.  Genitourinary: Negative for dyspareunia, dysuria, flank pain, frequency, genital sores, hematuria, menstrual problem, pelvic pain, urgency, vaginal bleeding, vaginal discharge and vaginal pain.  Musculoskeletal: Negative for back pain, joint swelling and myalgias.  Skin: Negative for rash.  Neurological: Negative for dizziness, syncope, light-headedness, numbness and headaches.  Hematological: Negative for adenopathy.  Psychiatric/Behavioral: Negative for agitation, confusion, sleep disturbance and suicidal ideas. The patient is not nervous/anxious.   BREAST: No symptoms    Objective: BP 110/80   Ht 4\' 11"  (1.499 m)   Wt 120 lb (54.4 kg)   BMI 24.24 kg/m    Physical Exam Constitutional:      Appearance: She is well-developed.  Genitourinary:     Vulva, vagina, cervix, uterus, right adnexa and left adnexa normal.     No vulval lesion or tenderness noted.     No vaginal discharge, erythema or tenderness.     No cervical polyp.     Uterus is not enlarged or tender.     No right or left adnexal mass present.     Right adnexa not tender.     Left adnexa not tender.  Neck:     Thyroid: No thyromegaly.  Cardiovascular:     Rate and Rhythm: Normal rate and regular rhythm.     Heart sounds: Normal heart sounds. No murmur heard.   Pulmonary:     Effort: Pulmonary effort is normal.     Breath sounds: Normal breath sounds.  Chest:     Breasts:        Right: No mass, nipple discharge, skin change  or tenderness.        Left: No mass, nipple discharge, skin change or tenderness.  Abdominal:     Palpations: Abdomen is soft.     Tenderness: There is no abdominal tenderness. There is no guarding.  Musculoskeletal:        General: Normal range of motion.     Cervical back: Normal range of motion.  Neurological:     General: No focal deficit present.     Mental Status: She is alert and oriented to person, place, and time.     Cranial Nerves: No cranial nerve deficit.  Skin:    General: Skin is warm and dry.  Psychiatric:        Mood and Affect: Mood normal.        Behavior: Behavior normal.        Thought Content: Thought content normal.        Judgment: Judgment normal.  Vitals reviewed.     Assessment/Plan:  Encounter for annual routine gynecological examination  Encounter for screening mammogram for malignant neoplasm of breast - Plan: MM 3D SCREEN BREAST BILATERAL; pt to sched mammo  Tinnitus of both ears--pt to f/u with ENT. Will call for ref if needed.  Blood tests for routine general physical examination - Plan: Lipid panel, Comprehensive metabolic panel, CANCELED: Comprehensive metabolic panel  Screening cholesterol level - Plan: Lipid panel  Screening for diabetes mellitus - Plan: Comprehensive metabolic panel          GYN counsel breast self exam, mammography screening, menopause, adequate intake of calcium and vitamin D, diet and exercise    F/U  Return in about 1 year (around 03/28/2021).  Jayde Daffin B. Debra Calabretta, PA-C 03/28/2020 9:35 AM

## 2020-03-28 ENCOUNTER — Other Ambulatory Visit: Payer: Self-pay

## 2020-03-28 ENCOUNTER — Ambulatory Visit (INDEPENDENT_AMBULATORY_CARE_PROVIDER_SITE_OTHER): Payer: PRIVATE HEALTH INSURANCE | Admitting: Obstetrics and Gynecology

## 2020-03-28 ENCOUNTER — Encounter: Payer: Self-pay | Admitting: Obstetrics and Gynecology

## 2020-03-28 VITALS — BP 110/80 | Ht 59.0 in | Wt 120.0 lb

## 2020-03-28 DIAGNOSIS — Z01419 Encounter for gynecological examination (general) (routine) without abnormal findings: Secondary | ICD-10-CM

## 2020-03-28 DIAGNOSIS — H9313 Tinnitus, bilateral: Secondary | ICD-10-CM

## 2020-03-28 DIAGNOSIS — Z1231 Encounter for screening mammogram for malignant neoplasm of breast: Secondary | ICD-10-CM

## 2020-03-28 DIAGNOSIS — Z0000 Encounter for general adult medical examination without abnormal findings: Secondary | ICD-10-CM | POA: Diagnosis not present

## 2020-03-28 DIAGNOSIS — Z13220 Encounter for screening for lipoid disorders: Secondary | ICD-10-CM

## 2020-03-28 DIAGNOSIS — Z131 Encounter for screening for diabetes mellitus: Secondary | ICD-10-CM

## 2020-03-28 NOTE — Patient Instructions (Signed)
I value your feedback and entrusting us with your care. If you get a [facility] patient survey, I would appreciate you taking the time to let us know about your experience today. Thank you! ° °As of April 22, 2019, your lab results will be released to your MyChart immediately, before I even have a chance to see them. Please give me time to review them and contact you if there are any abnormalities. Thank you for your patience.  ° °[facility]: [phone] ° ° ° °

## 2020-03-29 LAB — LIPID PANEL
Chol/HDL Ratio: 4 ratio (ref 0.0–4.4)
Cholesterol, Total: 257 mg/dL — ABNORMAL HIGH (ref 100–199)
HDL: 65 mg/dL (ref >39)
LDL Chol Calc (NIH): 167 mg/dL — ABNORMAL HIGH (ref 0–99)
Triglycerides: 138 mg/dL (ref 0–149)
VLDL Cholesterol Cal: 25 mg/dL (ref 5–40)

## 2020-03-29 LAB — COMPREHENSIVE METABOLIC PANEL
ALT: 12 IU/L (ref 0–32)
AST: 16 IU/L (ref 0–40)
Albumin/Globulin Ratio: 2.1 (ref 1.2–2.2)
Albumin: 4.7 g/dL (ref 3.8–4.8)
Alkaline Phosphatase: 96 IU/L (ref 44–121)
BUN/Creatinine Ratio: 12 (ref 12–28)
BUN: 10 mg/dL (ref 8–27)
Bilirubin Total: 0.5 mg/dL (ref 0.0–1.2)
CO2: 24 mmol/L (ref 20–29)
Calcium: 9.5 mg/dL (ref 8.7–10.3)
Chloride: 103 mmol/L (ref 96–106)
Creatinine, Ser: 0.83 mg/dL (ref 0.57–1.00)
GFR calc Af Amer: 88 mL/min/{1.73_m2} (ref >59)
GFR calc non Af Amer: 76 mL/min/{1.73_m2} (ref >59)
Globulin, Total: 2.2 g/dL (ref 1.5–4.5)
Glucose: 83 mg/dL (ref 65–99)
Potassium: 4.1 mmol/L (ref 3.5–5.2)
Sodium: 142 mmol/L (ref 134–144)
Total Protein: 6.9 g/dL (ref 6.0–8.5)

## 2020-04-24 ENCOUNTER — Other Ambulatory Visit: Payer: Self-pay

## 2020-04-24 ENCOUNTER — Ambulatory Visit
Admission: RE | Admit: 2020-04-24 | Discharge: 2020-04-24 | Disposition: A | Discharge status: Home / Self Care | Payer: PRIVATE HEALTH INSURANCE | Source: Ambulatory Visit | Attending: Obstetrics and Gynecology | Admitting: Obstetrics and Gynecology

## 2020-04-24 DIAGNOSIS — Z1231 Encounter for screening mammogram for malignant neoplasm of breast: Secondary | ICD-10-CM | POA: Diagnosis present

## 2020-05-02 ENCOUNTER — Encounter: Payer: Self-pay | Admitting: Obstetrics and Gynecology

## 2021-03-12 ENCOUNTER — Other Ambulatory Visit: Payer: Self-pay | Admitting: Obstetrics and Gynecology

## 2021-03-12 DIAGNOSIS — Z1231 Encounter for screening mammogram for malignant neoplasm of breast: Secondary | ICD-10-CM

## 2021-04-02 ENCOUNTER — Other Ambulatory Visit (HOSPITAL_COMMUNITY)
Admission: RE | Admit: 2021-04-02 | Discharge: 2021-04-02 | Disposition: A | Discharge status: Home / Self Care | Payer: 59 | Source: Ambulatory Visit | Attending: Obstetrics and Gynecology | Admitting: Obstetrics and Gynecology

## 2021-04-02 ENCOUNTER — Ambulatory Visit (INDEPENDENT_AMBULATORY_CARE_PROVIDER_SITE_OTHER): Payer: 59 | Admitting: Obstetrics and Gynecology

## 2021-04-02 ENCOUNTER — Other Ambulatory Visit: Payer: Self-pay

## 2021-04-02 ENCOUNTER — Encounter: Payer: Self-pay | Admitting: Obstetrics and Gynecology

## 2021-04-02 VITALS — BP 112/66 | HR 72 | Ht 59.0 in | Wt 118.0 lb

## 2021-04-02 DIAGNOSIS — Z13220 Encounter for screening for lipoid disorders: Secondary | ICD-10-CM

## 2021-04-02 DIAGNOSIS — Z1231 Encounter for screening mammogram for malignant neoplasm of breast: Secondary | ICD-10-CM | POA: Diagnosis not present

## 2021-04-02 DIAGNOSIS — N6314 Unspecified lump in the right breast, lower inner quadrant: Secondary | ICD-10-CM

## 2021-04-02 DIAGNOSIS — Z124 Encounter for screening for malignant neoplasm of cervix: Secondary | ICD-10-CM | POA: Insufficient documentation

## 2021-04-02 DIAGNOSIS — Z131 Encounter for screening for diabetes mellitus: Secondary | ICD-10-CM

## 2021-04-02 DIAGNOSIS — F419 Anxiety disorder, unspecified: Secondary | ICD-10-CM

## 2021-04-02 DIAGNOSIS — Z01419 Encounter for gynecological examination (general) (routine) without abnormal findings: Secondary | ICD-10-CM

## 2021-04-02 DIAGNOSIS — Z9189 Other specified personal risk factors, not elsewhere classified: Secondary | ICD-10-CM

## 2021-04-02 DIAGNOSIS — Z130 Encounter for screening for diseases of the blood and blood-forming organs and certain disorders involving the immune mechanism: Secondary | ICD-10-CM

## 2021-04-02 DIAGNOSIS — M8580 Other specified disorders of bone density and structure, unspecified site: Secondary | ICD-10-CM

## 2021-04-02 DIAGNOSIS — Z1329 Encounter for screening for other suspected endocrine disorder: Secondary | ICD-10-CM

## 2021-04-02 DIAGNOSIS — Z8619 Personal history of other infectious and parasitic diseases: Secondary | ICD-10-CM

## 2021-04-02 MED ORDER — HYDROXYZINE HCL 25 MG PO TABS: 25.0000 mg | ORAL_TABLET | Freq: Four times a day (QID) | ORAL | 1 refills | Status: AC | PRN

## 2021-04-02 MED ORDER — VALACYCLOVIR HCL 1 G PO TABS: ORAL_TABLET | ORAL | 0 refills | Status: AC

## 2021-04-02 NOTE — Progress Notes (Signed)
Gynecology Annual Exam  PCP: Patient, No Pcp Per (Inactive)  Chief Complaint:  Chief Complaint  Patient presents with   Gynecologic Exam    Annual - no concerns. RM 1    History of Present Illness: Patient is a 59 y.o. G1P1001 presents for annual exam. The patient has no complaints today.   LMP: No LMP recorded. Patient is postmenopausal. She denies postmenopausal bleeding or spotting  The patient is not currently sexually active. She denies dyspareunia.  Postcoital Bleeding: not applicable   The patient does perform self breast exams.  There is no notable family history of breast or ovarian cancer in her family.  The patient has regular exercise: yes, walking and stationary bike 4-5 days a week  The patient denies current symptoms of depression.   PHQ-9: 1 GAD-7: 1   Review of Systems: Review of Systems  Constitutional:  Positive for malaise/fatigue. Negative for chills, fever and weight loss.  HENT:  Positive for congestion. Negative for hearing loss and sinus pain.   Eyes:  Negative for blurred vision and double vision.  Respiratory:  Positive for cough. Negative for sputum production, shortness of breath and wheezing.   Cardiovascular:  Negative for chest pain, palpitations, orthopnea and leg swelling.  Gastrointestinal:  Negative for abdominal pain, constipation, diarrhea, nausea and vomiting.  Genitourinary:  Negative for dysuria, flank pain, frequency, hematuria and urgency.  Musculoskeletal:  Negative for back pain, falls and joint pain.  Skin:  Negative for itching and rash.  Neurological:  Negative for dizziness and headaches.  Psychiatric/Behavioral:  Negative for depression, substance abuse and suicidal ideas. The patient is not nervous/anxious.    Past Medical History:  Past Medical History:  Diagnosis Date   Colon polyps    Duodenitis    Family history of colon cancer    Family history of kidney cancer    Hemorrhoids    Hiatal hernia     Hyperlipidemia    IBS (irritable bowel syndrome)    Lichen sclerosus et atrophicus 2017   Migraine    Multiple gastric ulcers    Schatzki's ring    Seasonal allergies    Shortness of breath     Past Surgical History:  Past Surgical History:  Procedure Laterality Date   COLONOSCOPY W/ BIOPSIES  02/2011 and 04/2016   colon polyps   ESOPHAGOGASTRODUODENOSCOPY N/A 11/30/2014   Procedure: ESOPHAGOGASTRODUODENOSCOPY (EGD);  Surgeon: Manya Silvas, MD;  Location: [facility] ENDOSCOPY;  Service: Endoscopy;  Laterality: N/A;   HEMORRHOID SURGERY N/A 04/06/2018   Procedure: HEMORRHOIDECTOMY;  Surgeon: Robert Bellow, MD;  Location: [facility] ORS;  Service: General;  Laterality: N/A;   SAVORY DILATION N/A 11/30/2014   Procedure: Azzie Almas DILATION;  Surgeon: Manya Silvas, MD;  Location: [facility] ENDOSCOPY;  Service: Endoscopy;  Laterality: N/A;   VULVA /PERINEUM BIOPSY  02/2016    Gynecologic History:  No LMP recorded. Patient is postmenopausal. Last Pap: Results were: 2020 NIL  Last mammogram: 2021 Results were: BI-RAD I  Obstetric History: G1P1001  Family History:  Family History  Problem Relation Age of Onset   Alzheimer's disease Mother        moved to [place] in 2018   Endometrial cancer Mother 45   Cancer Mother    Stroke Mother 63       deceased - massive brain hemorrhage that lead to a massive stroke   Brain cancer Father 32   Hypertension Father    Cancer Father  kidney, lung and brain   Hyperlipidemia Brother    Stomach cancer Paternal Grandmother 48   Melanoma Paternal Grandfather 20   Alzheimer's disease Maternal Uncle        two maternal uncles   Colon cancer Cousin 50       paternal half cousin   Heart disease Paternal Uncle    Breast cancer Neg Hx     Social History:  Social History   Socioeconomic History   Marital status: Married    Spouse name: Not on file   Number of children: 1   Years of education: 16   Highest education level: Not on file   Occupational History   Occupation: Airline pilot    Comment: BS from [employer]  Tobacco Use   Smoking status: Never   Smokeless tobacco: Never  Vaping Use   Vaping Use: Never used  Substance and Sexual Activity   Alcohol use: Yes    Comment: occasional   Drug use: No   Sexual activity: Not Currently    Partners: Male    Birth control/protection: Post-menopausal  Other Topics Concern   Not on file  Social History Narrative   Not on file   Social Determinants of Health   Financial Resource Strain: Not on file  Food Insecurity: Not on file  Transportation Needs: Not on file  Physical Activity: Not on file  Stress: Not on file  Social Connections: Not on file  Intimate Partner Violence: Not on file    Allergies:  No Known Allergies  Medications: Prior to Admission medications   Medication Sig Start Date End Date Taking? Authorizing Provider  Calcium Carbonate-Vit D-Min (CALTRATE 600+D PLUS PO) Take by mouth.   Yes [provider]  cholecalciferol (VITAMIN D3) 25 MCG (1000 UT) tablet Take 1,000 Units by mouth daily.   Yes [provider]  Cyanocobalamin (VITAMIN B 12 PO) Take 1,000 mcg by mouth daily.    Yes [provider]  loratadine (CLARITIN) 10 MG tablet Take 10 mg by mouth every morning.    Yes [provider]  psyllium (REGULOID) 0.52 g capsule Take 0.52 g by mouth daily.   Yes [provider]  hydrOXYzine (ATARAX/VISTARIL) 25 MG tablet Take 1 tablet (25 mg total) by mouth every 6 (six) hours as needed for anxiety. Patient not taking: Reported on 03/28/2020 03/26/19   Farrel Conners, CNM  Omega-3 Fatty Acids (FISH OIL) 500 MG CAPS Take 1 capsule by mouth daily. Four days a week. Patient not taking: Reported on 04/02/2021    [provider]  valACYclovir (VALTREX) 1000 MG tablet Take 2 tablets every 12 hours x 2 doses at onset of cold sores 03/26/19   Farrel Conners, CNM  vitamin E 100 UNIT capsule Take 100 Units  by mouth daily. Patient not taking: Reported on 03/28/2020    [provider]  Wheat Dextrin (BENEFIBER DRINK MIX PO) Take 1 Scoop by mouth daily.  Patient not taking: Reported on 04/02/2021    [provider]  Zoster Vaccine Adjuvanted [facility]) injection Inject 0.5 ml IM now and repeat another in 2-6 months. Patient not taking: Reported on 03/28/2020 03/26/19   Farrel Conners, CNM    Physical Exam Vitals: Blood pressure 112/66, pulse 72, height 4\' 11"  (1.499 m), weight 118 lb (53.5 kg).  Physical Exam Constitutional:      Appearance: She is well-developed.  Genitourinary:     Genitourinary Comments: External: Normal appearing vulva. No lesions noted.  Speculum examination: Normal appearing cervix.  No blood in the vaginal vault. No discharge.  Bimanual examination: Uterus midline, non-tender, normal in size, shape and contour.  No CMT. No adnexal masses. No adnexal tenderness. Pelvis not fixed.  Breast Exam: breast equal without skin changes, nipple discharge, left side without breast lump or enlarged lymph nodes and right breast with an abnormal finding:  right breast lump approx. 1 cm at 4 o'clock   HENT:     Head: Normocephalic and atraumatic.  Neck:     Thyroid: No thyromegaly.  Cardiovascular:     Rate and Rhythm: Normal rate and regular rhythm.     Heart sounds: Normal heart sounds.  Pulmonary:     Effort: Pulmonary effort is normal.     Breath sounds: Normal breath sounds.  Abdominal:     General: Bowel sounds are normal. There is no distension.     Palpations: Abdomen is soft. There is no mass.  Musculoskeletal:     Cervical back: Neck supple.  Neurological:     Mental Status: She is alert and oriented to person, place, and time.  Skin:    General: Skin is warm and dry.  Psychiatric:        Behavior: Behavior normal.        Thought Content: Thought content normal.        Judgment: Judgment normal.  Vitals reviewed.     Female chaperone  present for pelvic and breast  portions of the physical exam  Assessment: 59 y.o. G1P1001 routine annual exam  Plan: Problem List Items Addressed This Visit   None Visit Diagnoses     Encounter for annual routine gynecological examination    -  Primary   Cervical cancer screening       Relevant Orders   Cytology - PAP   Breast cancer screening by mammogram       Screening cholesterol level       Relevant Orders   Lipid panel   Screening for thyroid disorder       Relevant Orders   TSH   Screening for deficiency anemia       Relevant Orders   CBC With Differential   Screening for diabetes mellitus       Relevant Orders   Comprehensive metabolic panel   Hemoglobin A1c   Osteopenia, unspecified location       Relevant Orders   DG Bone Density       1) Mammogram - recommend yearly screening mammogram. Diagnostic  Mammogram and ultrasound  was ordered for breast lump in right breast.  2) STI screening was offered and declined  3) pap smear today at patient request  4) Colonoscopy -- patient has planned follow up for 2023, last in 2017.   5) Routine healthcare maintenance including cholesterol, diabetes screening discussed Ordered today  6) Osteoporosis screening - increased risk- DEXA ordered. History in 2014 of osteopenia    Adrian Prows MD, [place], [facility] 04/02/2021 4:27 PM

## 2021-04-02 NOTE — Patient Instructions (Signed)
Institute of Medicine Recommended Dietary Allowances for Calcium and Vitamin D  Age (yr) Calcium Recommended Dietary Allowance (mg/day) Vitamin D Recommended Dietary Allowance (international units/day)  9-18 1,300 600  19-50 1,000 600  51-70 1,200 600  71 and older 1,200 800  Data from Institute of Medicine. Dietary reference intakes: calcium, vitamin D. [place], [place]: National Academies Press; 2011.   Exercising to Stay Healthy To become healthy and stay healthy, it is recommended that you do moderate-intensity and vigorous-intensity exercise. You can tell that you are exercising at a moderate intensity if your heart starts beating faster and you start breathing faster but can still hold a conversation. You can tell that you are exercising at a vigorous intensity if you are breathing much harder and faster and cannot hold a conversation while exercising. How can exercise benefit me? Exercising regularly is important. It has many health benefits, such as: Improving overall fitness, flexibility, and endurance. Increasing bone density. Helping with weight control. Decreasing body fat. Increasing muscle strength and endurance. Reducing stress and tension, anxiety, depression, or anger. Improving overall health. What guidelines should I follow while exercising? Before you start a new exercise program, talk with your health care provider. Do not exercise so much that you hurt yourself, feel dizzy, or get very short of breath. Wear comfortable clothes and wear shoes with good support. Drink plenty of water while you exercise to prevent dehydration or heat stroke. Work out until your breathing and your heartbeat get faster (moderate intensity). How often should I exercise? Choose an activity that you enjoy, and set realistic goals. Your health care provider can help you make an activity plan that is individually designed and works best for you. Exercise regularly as told by your health  care provider. This may include: Doing strength training two times a week, such as: Lifting weights. Using resistance bands. Push-ups. Sit-ups. Yoga. Doing a certain intensity of exercise for a given amount of time. Choose from these options: A total of 150 minutes of moderate-intensity exercise every week. A total of 75 minutes of vigorous-intensity exercise every week. A mix of moderate-intensity and vigorous-intensity exercise every week. Children, pregnant women, people who have not exercised regularly, people who are overweight, and older adults may need to talk with a health care provider about what activities are safe to perform. If you have a medical condition, be sure to talk with your health care provider before you start a new exercise program. What are some exercise ideas? Moderate-intensity exercise ideas include: Walking 1 mile (1.6 km) in about 15 minutes. Biking. Hiking. Golfing. Dancing. Water aerobics. Vigorous-intensity exercise ideas include: Walking 4.5 miles (7.2 km) or more in about 1 hour. Jogging or running 5 miles (8 km) in about 1 hour. Biking 10 miles (16.1 km) or more in about 1 hour. Lap swimming. Roller-skating or in-line skating. Cross-country skiing. Vigorous competitive sports, such as football, basketball, and soccer. Jumping rope. Aerobic dancing. What are some everyday activities that can help me get exercise? Yard work, such as: Pushing a lawn mower. Raking and bagging leaves. Washing your car. Pushing a stroller. Shoveling snow. Gardening. Washing windows or floors. How can I be more active in my day-to-day activities? Use stairs instead of an elevator. Take a walk during your lunch break. If you drive, park your car farther away from your work or school. If you take public transportation, get off one stop early and walk the rest of the way. Stand up or walk around during all of   your indoor phone calls. Get up, stretch, and walk  around every 30 minutes throughout the day. Enjoy exercise with a friend. Support to continue exercising will help you keep a regular routine of activity. Where to find more information You can find more information about exercising to stay healthy from: U.S. Department of Health and Human Services: www.hhs.gov Centers for Disease Control and Prevention (CDC): www.cdc.gov Summary Exercising regularly is important. It will improve your overall fitness, flexibility, and endurance. Regular exercise will also improve your overall health. It can help you control your weight, reduce stress, and improve your bone density. Do not exercise so much that you hurt yourself, feel dizzy, or get very short of breath. Before you start a new exercise program, talk with your health care provider. This information is not intended to replace advice given to you by your health care provider. Make sure you discuss any questions you have with your health care provider. Document Revised: 08/25/2020 Document Reviewed: 08/25/2020 Elsevier Patient Education  2022 Elsevier Inc. Budget-Friendly Healthy Eating There are many ways to save money at the grocery store and continue to eat healthy. You can be successful if you: Plan meals according to your budget. Make a grocery list and only purchase food according to your grocery list. Prepare food yourself at home. What are tips for following this plan? Reading food labels Compare food labels between brand name foods and the store brand. Often the nutritional value is the same, but the store brand is lower cost. Look for products that do not have added sugar, fat, or salt (sodium). These often cost the same but are healthier for you. Products may be labeled as: Sugar-free. Nonfat. Low-fat. Sodium-free. Low-sodium. Look for lean ground beef labeled as at least 92% lean and 8% fat. Shopping  Buy only the items on your grocery list and go only to the areas of the store  that have the items on your list. Use coupons only for foods and brands you normally buy. Avoid buying items you wouldn't normally buy simply because they are on sale. Check online and in newspapers for weekly deals. Buy healthy items from the bulk bins when available, such as herbs, spices, flour, pasta, nuts, and dried fruit. Buy fruits and vegetables that are in season. Prices are usually lower on in-season produce. Look at the unit price on the price tag. Use it to compare different brands and sizes to find out which item is the best deal. Choose healthy items that are often low-cost, such as carrots, potatoes, apples, bananas, and oranges. Dried or canned beans are a low-cost protein source. Buy in bulk and freeze extra food. Items you can buy in bulk include meats, fish, poultry, frozen fruits, and frozen vegetables. Avoid buying "ready-to-eat" foods, such as pre-cut fruits and vegetables and pre-made salads. If possible, shop around to discover where you can find the best prices. Consider other retailers such as dollar stores, larger wholesale stores, local fruit and vegetable stands, and farmers markets. Do not shop when you are hungry. If you shop while hungry, it may be hard to stick to your list and budget. Resist impulse buying. Use your grocery list as your official plan for the week. Buy a variety of vegetables and fruits by purchasing fresh, frozen, and canned items. Look at the top and bottom shelves for deals. Foods at eye level (eye level of an adult or child) are usually more expensive. Be efficient with your time when shopping. The more time you   spend at the store, the more money you are likely to spend. To save money when choosing more expensive foods like meats and dairy: Choose cheaper cuts of meat, such as bone-in chicken thighs and drumsticks instead of skinless and boneless chicken. When you are ready to prepare the chicken, you can remove the skin yourself to make it  healthier. Choose lean meats like chicken or turkey instead of beef. Choose canned seafood, such as tuna, salmon, or sardines. Buy eggs as a low-cost source of protein. Buy dried beans and peas, such as lentils, split peas, or kidney beans instead of meats. Dried beans and peas are a good alternative source of protein. Buy the larger tubs of yogurt instead of individual-sized containers. Choose water instead of sodas and other sweetened beverages. Avoid buying chips, cookies, and other "junk food." These items are usually expensive and not healthy. Cooking Make extra food and freeze the extras in meal-sized containers or in individual portions for fast meals and snacks. Pre-cook on days when you have extra time to prepare meals in advance. You can keep these meals in the fridge or freezer and reheat for a quick meal. When you come home from the grocery store, wash, peel, and cut fruits and vegetables so they are ready to use and eat. This will help reduce food waste. Meal planning Do not eat out or get fast food. Prepare food at home. Make a grocery list and make sure to bring it with you to the store. If you have a smart phone, you could use your phone to create your shopping list. Plan meals and snacks according to a grocery list and budget you create. Use leftovers in your meal plan for the week. Look for recipes where you can cook once and make enough food for two meals. Prepare budget-friendly types of meals like stews, casseroles, and stir-fry dishes. Try some meatless meals or try "no cook" meals like salads. Make sure that half your plate is filled with fruits or vegetables. Choose from fresh, frozen, or canned fruits and vegetables. If eating canned, remember to rinse them before eating. This will remove any excess salt added for packaging. Summary Eating healthy on a budget is possible if you plan your meals according to your budget, purchase according to your budget and grocery list,  and prepare food yourself. Tips for buying more food on a limited budget include buying generic brands, using coupons only for foods you normally buy, and buying healthy items from the bulk bins when available. Tips for buying cheaper food to replace expensive food include choosing cheaper, lean cuts of meat, and buying dried beans and peas. This information is not intended to replace advice given to you by your health care provider. Make sure you discuss any questions you have with your health care provider. Document Revised: 02/10/2020 Document Reviewed: 02/10/2020 Elsevier Patient Education  2022 Elsevier Inc. Bone Health Bones protect organs, store calcium, anchor muscles, and support the whole body. Keeping your bones strong is important, especially as you get older. You can take actions to help keep your bones strong and healthy. Why is keeping my bones healthy important? Keeping your bones healthy is important because your body constantly replaces bone cells. Cells get old, and new cells take their place. As we age, we lose bone cells because the body may not be able to make enough new cells to replace the old cells. The amount of bone cells and bone tissue you have is referred to as   bone mass. The higher your bone mass, the stronger your bones. The aging process leads to an overall loss of bone mass in the body, which can increase the likelihood of: Broken bones. A condition in which the bones become weak and brittle (osteoporosis). A large decline in bone mass occurs in older adults. In women, it occurs about the time of menopause. What actions can I take to keep my bones healthy? Good health habits are important for maintaining healthy bones. This includes eating nutritious foods and exercising regularly. To have healthy bones, you need to get enough of the right minerals and vitamins. Most nutrition experts recommend getting these nutrients from the foods that you eat. In some cases,  taking supplements may also be recommended. Doing certain types of exercise is also important for bone health. What are the nutritional recommendations for healthy bones? Eating a well-balanced diet with plenty of calcium and vitamin D will help to protect your bones. Nutritional recommendations vary from person to person. Ask your health care provider what is healthy for you. Here are some general guidelines. Get enough calcium Calcium is the most important (essential) mineral for bone health. Most people can get enough calcium from their diet, but supplements may be recommended for people who are at risk for osteoporosis. Good sources of calcium include: Dairy products, such as low-fat or nonfat milk, cheese, and yogurt. Dark green leafy vegetables, such as bok choy and broccoli. Foods that have calcium added to them (are fortified). Foods that may be fortified with calcium include orange juice, cereal, bread, soy beverages, and tofu products. Nuts, such as almonds. Follow these recommended amounts for daily calcium intake: Infants, 0-6 months: 200 mg. Infants, 6-12 months: 260 mg. Children, age 1-3: 700 mg. Children, age 4-8: 1,000 mg. Children, age 9-13: 1,300 mg. Teens, age 14-18: 1,300 mg. Adults, age 19-50: 1,000 mg. Adults, age 51-70: Men: 1,000 mg. Women: 1,200 mg. Adults, age 71 or older: 1,200 mg. Pregnant and breastfeeding females: Teens: 1,300 mg. Adults: 1,000 mg. Get enough vitamin D Vitamin D is the most essential vitamin for bone health. It helps the body absorb calcium. Sunlight stimulates the skin to make vitamin D, so be sure to get enough sunlight. If you live in a cold climate or you do not get outside often, your health care provider may recommend that you take vitamin D supplements. Good sources of vitamin D in your diet include: Egg yolks. Saltwater fish. Milk and cereal fortified with vitamin D. Follow these recommended amounts for daily vitamin D  intake: Infants, 0-12 months: 400 international units (IU). Children and teens, age 1-18: 600 international units. Adults, age 59 or younger: 600 international units. Adults, age 60 or older: 600-1,000 international units. Get other important nutrients Other nutrients that are important for bone health include: Phosphorus. This mineral is found in meat, poultry, dairy foods, nuts, and legumes. The recommended daily intake for adult men and adult women is 700 mg. Magnesium. This mineral is found in seeds, nuts, dark green vegetables, and legumes. The recommended daily intake for adult men is 400-420 mg. For adult women, it is 310-320 mg. Vitamin K. This vitamin is found in green leafy vegetables. The recommended daily intake is 120 mcg for adult men and 90 mcg for adult women. What type of physical activity is best for building and maintaining healthy bones? Weight-bearing and strength-building activities are important for building and maintaining healthy bones. Weight-bearing activities cause muscles and bones to work against gravity. Strength-building activities   increase the strength of the muscles that support bones. Weight-bearing and muscle-building activities include: Walking and hiking. Jogging and running. Dancing. Gym exercises. Lifting weights. Tennis and racquetball. Climbing stairs. Aerobics. Adults should get at least 30 minutes of moderate physical activity on most days. Children should get at least 60 minutes of moderate physical activity on most days. Ask your health care provider what type of exercise is best for you. How can I find out if my bone mass is low? Bone mass can be measured with an X-ray test called a bone mineral density (BMD) test. This test is recommended for all women who are age 65 or older. It may also be recommended for: Men who are age 70 or older. People who are at risk for osteoporosis because of: Having a long-term disease that weakens bones, such as  kidney disease or rheumatoid arthritis. Having menopause earlier than normal. Taking medicine that weakens bones, such as steroids, thyroid hormones, or hormone treatment for breast cancer or prostate cancer. Smoking. Drinking three or more alcoholic drinks a day. Being underweight. Sedentary lifestyle. If you find that you have a low bone mass, you may be able to prevent osteoporosis or further bone loss by changing your diet and lifestyle. Where can I find more information? Bone Health & Osteoporosis Foundation: www.nof.org/patients National Institutes of Health: www.bones.nih.gov International Osteoporosis Foundation: www.iofbonehealth.org Summary The aging process leads to an overall loss of bone mass in the body, which can increase the likelihood of broken bones and osteoporosis. Eating a well-balanced diet with plenty of calcium and vitamin D will help to protect your bones. Weight-bearing and strength-building activities are also important for building and maintaining strong bones. Bone mass can be measured with an X-ray test called a bone mineral density (BMD) test. This information is not intended to replace advice given to you by your health care provider. Make sure you discuss any questions you have with your health care provider. Document Revised: 10/11/2020 Document Reviewed: 10/11/2020 Elsevier Patient Education  2022 Elsevier Inc.  

## 2021-04-03 LAB — COMPREHENSIVE METABOLIC PANEL
ALT: 9 IU/L (ref 0–32)
AST: 18 IU/L (ref 0–40)
Albumin/Globulin Ratio: 1.9 (ref 1.2–2.2)
Albumin: 4.5 g/dL (ref 3.8–4.8)
Alkaline Phosphatase: 95 IU/L (ref 44–121)
BUN/Creatinine Ratio: 12 (ref 12–28)
BUN: 9 mg/dL (ref 8–27)
Bilirubin Total: 0.6 mg/dL (ref 0.0–1.2)
CO2: 25 mmol/L (ref 20–29)
Calcium: 9.8 mg/dL (ref 8.7–10.3)
Chloride: 100 mmol/L (ref 96–106)
Creatinine, Ser: 0.75 mg/dL (ref 0.57–1.00)
Globulin, Total: 2.4 g/dL (ref 1.5–4.5)
Glucose: 85 mg/dL (ref 70–99)
Potassium: 4.7 mmol/L (ref 3.5–5.2)
Sodium: 141 mmol/L (ref 134–144)
Total Protein: 6.9 g/dL (ref 6.0–8.5)
eGFR: 90 mL/min/{1.73_m2} (ref >59)

## 2021-04-03 LAB — CBC WITH DIFFERENTIAL
Basophils Absolute: 0 10*3/uL (ref 0.0–0.2)
Basos: 1 %
EOS (ABSOLUTE): 0 10*3/uL (ref 0.0–0.4)
Eos: 1 %
Hematocrit: 40.6 % (ref 34.0–46.6)
Hemoglobin: 13.7 g/dL (ref 11.1–15.9)
Immature Grans (Abs): 0 10*3/uL (ref 0.0–0.1)
Immature Granulocytes: 0 %
Lymphocytes Absolute: 1.7 10*3/uL (ref 0.7–3.1)
Lymphs: 31 %
MCH: 30.9 pg (ref 26.6–33.0)
MCHC: 33.7 g/dL (ref 31.5–35.7)
MCV: 91 fL (ref 79–97)
Monocytes Absolute: 0.3 10*3/uL (ref 0.1–0.9)
Monocytes: 6 %
Neutrophils Absolute: 3.3 10*3/uL (ref 1.4–7.0)
Neutrophils: 61 %
RBC: 4.44 x10E6/uL (ref 3.77–5.28)
RDW: 12.5 % (ref 11.7–15.4)
WBC: 5.4 10*3/uL (ref 3.4–10.8)

## 2021-04-03 LAB — LIPID PANEL
Chol/HDL Ratio: 3.9 ratio (ref 0.0–4.4)
Cholesterol, Total: 236 mg/dL — ABNORMAL HIGH (ref 100–199)
HDL: 60 mg/dL (ref >39)
LDL Chol Calc (NIH): 153 mg/dL — ABNORMAL HIGH (ref 0–99)
Triglycerides: 127 mg/dL (ref 0–149)
VLDL Cholesterol Cal: 23 mg/dL (ref 5–40)

## 2021-04-03 LAB — HEMOGLOBIN A1C
Est. average glucose Bld gHb Est-mCnc: 108 mg/dL
Hgb A1c MFr Bld: 5.4 % (ref 4.8–5.6)

## 2021-04-03 LAB — TSH: TSH: 0.455 u[IU]/mL (ref 0.450–4.500)

## 2021-04-09 LAB — CYTOLOGY - PAP
Comment: NEGATIVE
Diagnosis: NEGATIVE
High risk HPV: NEGATIVE

## 2021-04-11 ENCOUNTER — Telehealth: Payer: Self-pay

## 2021-04-11 NOTE — Telephone Encounter (Signed)
Pt called checking on DEXA order. I explained it was in the system and that she can schedule with [place] when she goes in for da[facility] on 04/16/21.

## 2021-04-16 ENCOUNTER — Other Ambulatory Visit: Payer: Self-pay

## 2021-04-16 ENCOUNTER — Ambulatory Visit
Admission: RE | Admit: 2021-04-16 | Discharge: 2021-04-16 | Disposition: A | Discharge status: Home / Self Care | Payer: 59 | Source: Ambulatory Visit | Attending: Obstetrics and Gynecology | Admitting: Obstetrics and Gynecology

## 2021-04-16 DIAGNOSIS — N6314 Unspecified lump in the right breast, lower inner quadrant: Secondary | ICD-10-CM | POA: Insufficient documentation

## 2021-04-25 ENCOUNTER — Ambulatory Visit: Payer: No Typology Code available for payment source

## 2021-05-16 ENCOUNTER — Other Ambulatory Visit: Payer: Self-pay

## 2021-05-17 ENCOUNTER — Other Ambulatory Visit: Payer: 59

## 2021-05-28 DIAGNOSIS — Z86010 Personal history of colonic polyps: Secondary | ICD-10-CM | POA: Diagnosis not present

## 2021-08-24 ENCOUNTER — Encounter: Payer: Self-pay | Admitting: *Deleted

## 2021-08-27 ENCOUNTER — Ambulatory Visit
Admission: RE | Admit: 2021-08-27 | Discharge: 2021-08-27 | Disposition: A | Discharge status: Home / Self Care | Payer: 59 | Attending: Gastroenterology | Admitting: Gastroenterology

## 2021-08-27 ENCOUNTER — Other Ambulatory Visit: Payer: Self-pay

## 2021-08-27 ENCOUNTER — Ambulatory Visit: Payer: 59 | Admitting: Anesthesiology

## 2021-08-27 ENCOUNTER — Encounter: Payer: Self-pay | Admitting: *Deleted

## 2021-08-27 ENCOUNTER — Encounter
Admission: RE | Disposition: A | Discharge status: Home / Self Care | Payer: Self-pay | Source: Home / Self Care | Attending: Gastroenterology

## 2021-08-27 DIAGNOSIS — K219 Gastro-esophageal reflux disease without esophagitis: Secondary | ICD-10-CM | POA: Diagnosis not present

## 2021-08-27 DIAGNOSIS — G709 Myoneural disorder, unspecified: Secondary | ICD-10-CM | POA: Insufficient documentation

## 2021-08-27 DIAGNOSIS — K449 Diaphragmatic hernia without obstruction or gangrene: Secondary | ICD-10-CM | POA: Diagnosis not present

## 2021-08-27 DIAGNOSIS — K640 First degree hemorrhoids: Secondary | ICD-10-CM | POA: Diagnosis not present

## 2021-08-27 DIAGNOSIS — Z1211 Encounter for screening for malignant neoplasm of colon: Secondary | ICD-10-CM | POA: Diagnosis not present

## 2021-08-27 DIAGNOSIS — Z86010 Personal history of colonic polyps: Secondary | ICD-10-CM | POA: Diagnosis not present

## 2021-08-27 DIAGNOSIS — K649 Unspecified hemorrhoids: Secondary | ICD-10-CM | POA: Diagnosis not present

## 2021-08-27 HISTORY — PX: COLONOSCOPY WITH PROPOFOL: SHX5780

## 2021-08-27 SURGERY — COLONOSCOPY WITH PROPOFOL
Anesthesia: General

## 2021-08-27 MED ORDER — PHENYLEPHRINE HCL (PRESSORS) 10 MG/ML IV SOLN
INTRAVENOUS | Status: DC | PRN
Start: 2021-08-27 — End: 2021-08-27
  Administered 2021-08-27: 100 ug via INTRAVENOUS

## 2021-08-27 MED ORDER — PROPOFOL 10 MG/ML IV BOLUS
INTRAVENOUS | Status: DC | PRN
Start: 2021-08-27 — End: 2021-08-27
  Administered 2021-08-27: 165 ug/kg/min via INTRAVENOUS

## 2021-08-27 MED ORDER — SODIUM CHLORIDE 0.9 % IV SOLN: INTRAVENOUS | Status: DC

## 2021-08-27 MED ORDER — PROPOFOL 500 MG/50ML IV EMUL
INTRAVENOUS | Status: DC | PRN
  Administered 2021-08-27: 50 mg via INTRAVENOUS

## 2021-08-27 NOTE — Transfer of Care (Signed)
Immediate Anesthesia Transfer of Care Note ? ?Patient: Patricia Franklin ? ?Procedure(s) Performed: COLONOSCOPY WITH PROPOFOL ? ?Patient Location: PACU ? ?Anesthesia Type:General ? ?Level of Consciousness: awake and alert  ? ?Airway & Oxygen Therapy: Patient Spontanous Breathing and Patient connected to nasal cannula oxygen ? ?Post-op Assessment: Report given to RN and Post -op Vital signs reviewed and stable ? ?Post vital signs: Reviewed and stable ? ?Last Vitals:  ?Vitals Value Taken Time  ?BP 85/54 08/27/21 1241  ?Temp 36 ?C 08/27/21 1240  ?Pulse 64 08/27/21 1242  ?Resp 11 08/27/21 1242  ?SpO2 100 % 08/27/21 1242  ?Vitals shown include unvalidated device data. ? ?Last Pain:  ?Vitals:  ? 08/27/21 1240  ?TempSrc: Temporal  ?PainSc:   ?   ? ?  ? ?Complications: No notable events documented. ?

## 2021-08-27 NOTE — Op Note (Signed)
[facility] ?Gastroenterology ?Patient Name: Patricia Franklin ?Procedure Date: 08/27/2021 11:51 AM ?MRN: [id number] ?Account #: [id number] ?Date of Birth: 01-Dec-1960 ?Admit Type: Outpatient ?Age: 61 ?Room: [facility] ENDO ROOM 3 ?Gender: Female ?Note Status: Finalized ?Instrument Name: Colonscope [id number] ?Procedure:             Colonoscopy ?Indications:           Surveillance: Personal history of adenomatous polyps  ?                       on last colonoscopy 5 years ago ?Providers:             Andrey Farmer MD, MD ?Referring MD:          Forest Gleason Md, MD (Referring MD) ?Medicines:             Monitored Anesthesia Care ?Complications:         No immediate complications. ?Procedure:             Pre-Anesthesia Assessment: ?                       - Prior to the procedure, a History and Physical was  ?                       performed, and patient medications and allergies were  ?                       reviewed. The patient is competent. The risks and  ?                       benefits of the procedure and the sedation options and  ?                       risks were discussed with the patient. All questions  ?                       were answered and informed consent was obtained.  ?                       Patient identification and proposed procedure were  ?                       verified by the physician, the nurse, the  ?                       anesthesiologist, the anesthetist and the technician  ?                       in the endoscopy suite. Mental Status Examination:  ?                       alert and oriented. Airway Examination: normal  ?                       oropharyngeal airway and neck mobility. Respiratory  ?                       Examination: clear to auscultation. CV Examination:  ?  normal. Prophylactic Antibiotics: The patient does not  ?                       require prophylactic antibiotics. Prior  ?                       Anticoagulants: The patient has taken no previous  ?                        anticoagulant or antiplatelet agents. ASA Grade  ?                       Assessment: II - A patient with mild systemic disease.  ?                       After reviewing the risks and benefits, the patient  ?                       was deemed in satisfactory condition to undergo the  ?                       procedure. The anesthesia plan was to use monitored  ?                       anesthesia care (MAC). Immediately prior to  ?                       administration of medications, the patient was  ?                       re-assessed for adequacy to receive sedatives. The  ?                       heart rate, respiratory rate, oxygen saturations,  ?                       blood pressure, adequacy of pulmonary ventilation, and  ?                       response to care were monitored throughout the  ?                       procedure. The physical status of the patient was  ?                       re-assessed after the procedure. ?                       After obtaining informed consent, the colonoscope was  ?                       passed under direct vision. Throughout the procedure,  ?                       the patient's blood pressure, pulse, and oxygen  ?                       saturations were monitored continuously. The  ?  Colonoscope was introduced through the anus and  ?                       advanced to the the cecum, identified by appendiceal  ?                       orifice and ileocecal valve. The colonoscopy was  ?                       somewhat difficult due to a tortuous colon. The  ?                       patient tolerated the procedure well. The quality of  ?                       the bowel preparation was good. ?Findings: ?     The perianal and digital rectal examinations were normal. ?     Internal hemorrhoids were found during retroflexion. The hemorrhoids  ?     were Grade I (internal hemorrhoids that do not prolapse). ?     The exam was otherwise without  abnormality on direct and retroflexion  ?     views. ?Impression:            - Internal hemorrhoids. ?                       - The examination was otherwise normal on direct and  ?                       retroflexion views. ?                       - No specimens collected. ?Recommendation:        - Discharge patient to home. ?                       - Resume previous diet. ?                       - Continue present medications. ?                       - Repeat colonoscopy in 10 years for surveillance. ?                       - Return to referring physician as previously  ?                       scheduled. ?Procedure Code(s):     --- Professional --- ?                       G0105, Colorectal cancer screening; colonoscopy on  ?                       individual at high risk ?Diagnosis Code(s):     --- Professional --- ?                       Z86.010, Personal history of colonic polyps ?  K64.0, First degree hemorrhoids ?CPT copyright 2019 American Medical Association. All rights reserved. ?The codes documented in this report are preliminary and upon coder review may  ?be revised to meet current compliance requirements. ?Andrey Farmer MD, MD ?08/27/2021 12:43:01 PM ?Number of Addenda: 0 ?Note Initiated On: 08/27/2021 11:51 AM ?Scope Withdrawal Time: 0 hours 10 minutes 11 seconds  ?Total Procedure Duration: 0 hours 18 minutes 16 seconds  ?Estimated Blood Loss:  Estimated blood loss: none. ?     [facility] ?

## 2021-08-27 NOTE — H&P (Signed)
Outpatient short stay form Pre-procedure ?08/27/2021  ?Regis Bill, MD ? ?Primary Physician: Pcp, No ? ?Reason for visit:  Surveillance ? ?History of present illness:   ? ?61 y/o lady here for surveillance colonoscopy. Last colonoscopy was in 2017 with adenomatous polyps. No first degree relatives with colon cancer. No blood thinners. No significant abdominal surgeries. ? ? ? ?Current Facility-Administered Medications:  ?  0.9 %  sodium chloride infusion, , Intravenous, Continuous, Kharson Rasmusson, Rossie Muskrat, MD ? ?Medications Prior to Admission  ?Medication Sig Dispense Refill Last Dose  ? Calcium Carbonate-Vit D-Min (CALTRATE 600+D PLUS PO) Take by mouth.   Past Week  ? cholecalciferol (VITAMIN D3) 25 MCG (1000 UT) tablet Take 1,000 Units by mouth daily.   Past Week  ? Cyanocobalamin (VITAMIN B 12 PO) Take 1,000 mcg by mouth daily.    Past Week  ? hydrOXYzine (ATARAX/VISTARIL) 25 MG tablet Take 1 tablet (25 mg total) by mouth every 6 (six) hours as needed for anxiety. 30 tablet 1 Past Week  ? loratadine (CLARITIN) 10 MG tablet Take 10 mg by mouth every morning.    Past Week  ? valACYclovir (VALTREX) 1000 MG tablet Take 2 tablets every 12 hours x 2 doses at onset of cold sores 30 tablet 0 Past Month  ? Omega-3 Fatty Acids (FISH OIL) 500 MG CAPS Take 1 capsule by mouth daily. Four days a week. (Patient not taking: Reported on 04/02/2021)     ? psyllium (REGULOID) 0.52 g capsule Take 0.52 g by mouth daily.     ? vitamin E 100 UNIT capsule Take 100 Units by mouth daily. (Patient not taking: Reported on 03/28/2020)     ? Wheat Dextrin (BENEFIBER DRINK MIX PO) Take 1 Scoop by mouth daily.  (Patient not taking: Reported on 04/02/2021)     ? Zoster Vaccine Adjuvanted [facility]) injection Inject 0.5 ml IM now and repeat another in 2-6 months. (Patient not taking: Reported on 03/28/2020) 0.5 mL 0   ? ? ? ?No Known Allergies ? ? ?Past Medical History:  ?Diagnosis Date  ? Colon polyps   ? Duodenitis   ? Family history of colon  cancer   ? Family history of kidney cancer   ? Hemorrhoids   ? Hiatal hernia   ? Hyperlipidemia   ? IBS (irritable bowel syndrome)   ? Lichen sclerosus et atrophicus 2017  ? Migraine   ? Multiple gastric ulcers   ? Schatzki's ring   ? Seasonal allergies   ? Shortness of breath   ? ? ?Review of systems:  Otherwise negative.  ? ? ?Physical Exam ? ?Gen: Alert, oriented. Appears stated age.  ?HEENT: PERRLA. ?Lungs: No respiratory distress ?CV: RRR ?Abd: soft, benign, no masses ?Ext: No edema ? ? ? ?Planned procedures: Proceed with colonoscopy. The patient understands the nature of the planned procedure, indications, risks, alternatives and potential complications including but not limited to bleeding, infection, perforation, damage to internal organs and possible oversedation/side effects from anesthesia. The patient agrees and gives consent to proceed.  ?Please refer to procedure notes for findings, recommendations and patient disposition/instructions.  ? ? ? ?Regis Bill, MD ?[facility] ? ? ? ?  ? ?

## 2021-08-27 NOTE — Anesthesia Preprocedure Evaluation (Signed)
Anesthesia Evaluation  ?Patient identified by MRN, date of birth, ID band ?Patient awake ? ? ? ?Reviewed: ?Allergy & Precautions, NPO status , Patient's Chart, lab work & pertinent test results ? ?History of Anesthesia Complications ?Negative for: history of anesthetic complications ? ?Airway ?Mallampati: III ? ?TM Distance: <3 FB ?Neck ROM: full ? ? ? Dental ? ?(+) Chipped ?  ?Pulmonary ?neg pulmonary ROS, neg shortness of breath,  ?  ?Pulmonary exam normal ? ? ? ? ? ? ? Cardiovascular ?Exercise Tolerance: Good ?(-) anginanegative cardio ROS ?Normal cardiovascular exam ? ? ?  ?Neuro/Psych ? Headaches,  Neuromuscular disease negative psych ROS  ? GI/Hepatic ?Neg liver ROS, hiatal hernia, PUD, GERD  Controlled,  ?Endo/Other  ?negative endocrine ROS ? Renal/GU ?negative Renal ROS  ?negative genitourinary ?  ?Musculoskeletal ? ? Abdominal ?  ?Peds ? Hematology ?negative hematology ROS ?(+)   ?Anesthesia Other Findings ?Past Medical History: ?No date: Colon polyps ?No date: Duodenitis ?No date: Family history of colon cancer ?No date: Family history of kidney cancer ?No date: Hemorrhoids ?No date: Hiatal hernia ?No date: Hyperlipidemia ?No date: IBS (irritable bowel syndrome) ?0000000: Lichen sclerosus et atrophicus ?No date: Migraine ?No date: Multiple gastric ulcers ?No date: Schatzki's ring ?No date: Seasonal allergies ?No date: Shortness of breath ? ?Past Surgical History: ?02/2011 and 04/2016: COLONOSCOPY W/ BIOPSIES ?    Comment:  colon polyps ?11/30/2014: ESOPHAGOGASTRODUODENOSCOPY; N/A ?    Comment:  Procedure: ESOPHAGOGASTRODUODENOSCOPY (EGD);  Surgeon:  ?             Manya Silvas, MD;  Location: [facility] END[facility]COPY;   ?             Service: Endoscopy;  Laterality: N/A; ?04/06/2018: HEMORRHOID SURGERY; N/A ?    Comment:  Procedure: HEMORRHOIDECTOMY;  Surgeon: Hervey Ard  ?             W, MD;  Location: [facility] ORS;  Service: General;   ?             Laterality: N/A; ?11/30/2014:  SAVORY DILATION; N/A ?    Comment:  Procedure: SAVORY DILATION;  Surgeon: Manya Silvas,  ?             MD;  Location: [facility] ENDOSCOPY;  Service: Endoscopy;   ?             Laterality: N/A; ?02/2016: VULVA Milagros Loll BIOPSY ? ?BMI   ? Body Mass Index: 23.23 kg/m?  ?  ? ? Reproductive/Obstetrics ?negative OB ROS ? ?  ? ? ? ? ? ? ? ? ? ? ? ? ? ?  ?  ? ? ? ? ? ? ? ? ?Anesthesia Physical ?Anesthesia Plan ? ?ASA: 2 ? ?Anesthesia Plan: General  ? ?Post-op Pain Management:   ? ?Induction: Intravenous ? ?PONV Risk Score and Plan: Propofol infusion and TIVA ? ?Airway Management Planned: Natural Airway and Nasal Cannula ? ?Additional Equipment:  ? ?Intra-op Plan:  ? ?Post-operative Plan:  ? ?Informed Consent: I have reviewed the patients History and Physical, chart, labs and discussed the procedure including the risks, benefits and alternatives for the proposed anesthesia with the patient or authorized representative who has indicated his/her understanding and acceptance.  ? ? ? ?Dental Advisory Given ? ?Plan Discussed with: Anesthesiologist, CRNA and Surgeon ? ?Anesthesia Plan Comments: (Patient consented for risks of anesthesia including but not limited to:  ?- adverse reactions to medications ?- risk of airway placement if required ?- damage to eyes, teeth,  lips or other oral mucosa ?- nerve damage due to positioning  ?- sore throat or hoarseness ?- Damage to heart, brain, nerves, lungs, other parts of body or loss of life ? ?Patient voiced understanding.)  ? ? ? ? ? ? ?Anesthesia Quick Evaluation ? ?

## 2021-08-27 NOTE — Anesthesia Postprocedure Evaluation (Signed)
Anesthesia Post Note ? ?Patient: Patricia Franklin ? ?Procedure(s) Performed: COLONOSCOPY WITH PROPOFOL ? ?Patient location during evaluation: Endoscopy ?Anesthesia Type: General ?Level of consciousness: awake and alert ?Pain management: pain level controlled ?Vital Signs Assessment: post-procedure vital signs reviewed and stable ?Respiratory status: spontaneous breathing, nonlabored ventilation, respiratory function stable and patient connected to nasal cannula oxygen ?Cardiovascular status: blood pressure returned to baseline and stable ?Postop Assessment: no apparent nausea or vomiting ?Anesthetic complications: no ? ? ?No notable events documented. ? ? ?Last Vitals:  ?Vitals:  ? 08/27/21 1250 08/27/21 1300  ?BP:    ?Pulse:    ?Resp:    ?Temp:    ?SpO2: 100% 100%  ?  ?Last Pain:  ?Vitals:  ? 08/27/21 1300  ?TempSrc:   ?PainSc: 0-No pain  ? ? ?  ?  ?  ?  ?  ?  ? ?Cleda Mccreedy Romolo Sieling ? ? ? ? ?

## 2021-08-27 NOTE — Interval H&P Note (Signed)
History and Physical Interval Note: ? ?08/27/2021 ?12:07 PM ? ?Patricia Franklin  has presented today for surgery, with the diagnosis of personal history colon polyp.  The various methods of treatment have been discussed with the patient and family. After consideration of risks, benefits and other options for treatment, the patient has consented to  Procedure(s): ?COLONOSCOPY WITH PROPOFOL (N/A) as a surgical intervention.  The patient's history has been reviewed, patient examined, no change in status, stable for surgery.  I have reviewed the patient's chart and labs.  Questions were answered to the patient's satisfaction.   ? ? ?Rossie Muskrat Elyzabeth Goatley ? ?Ok to proceed with colonoscopy ?

## 2021-08-28 ENCOUNTER — Encounter: Payer: Self-pay | Admitting: Gastroenterology

## 2021-12-20 IMAGING — MG DIGITAL SCREENING BILAT W/ TOMO W/ CAD
8 series · 8 of 24 positions shown · non-contrast
Comparison: Previous exam(s).

CLINICAL DATA: Screening.

EXAM:
DIGITAL SCREENING BILATERAL MAMMOGRAM WITH TOMO AND CAD

[L CC synth-2D]
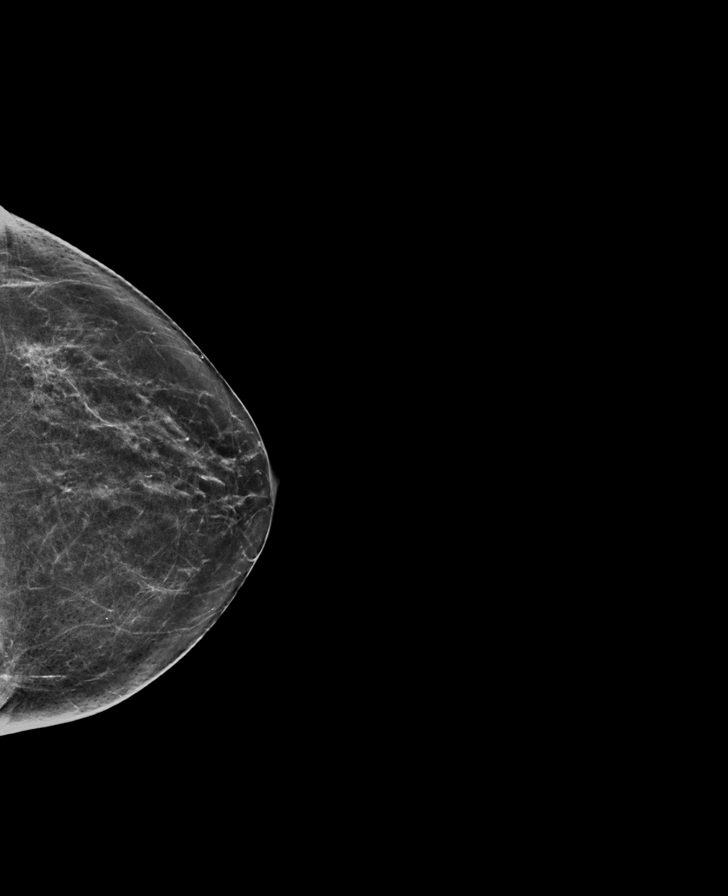

[L MLO synth-2D]
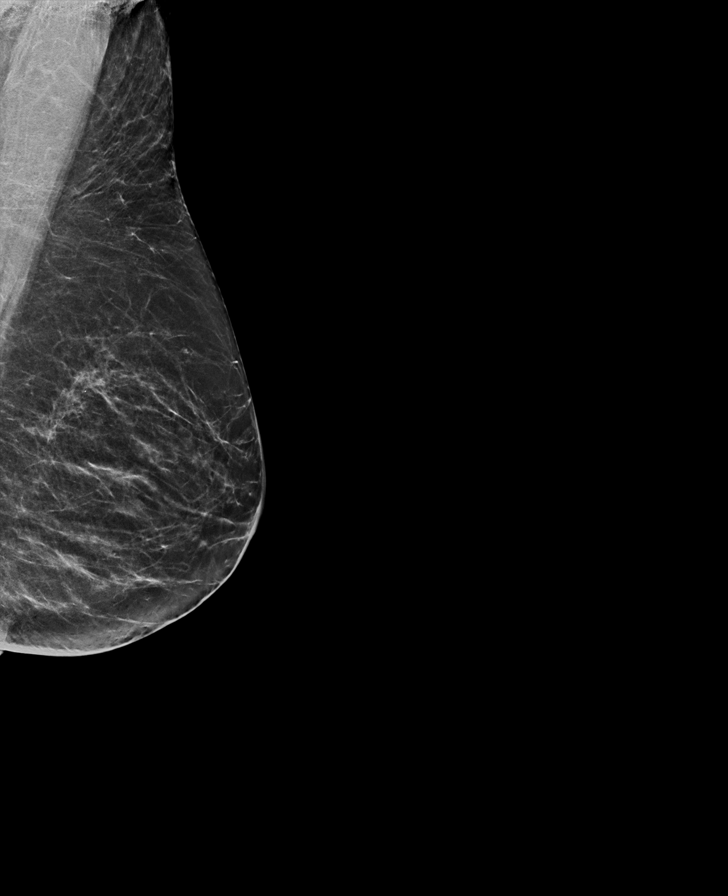

[R CC synth-2D]
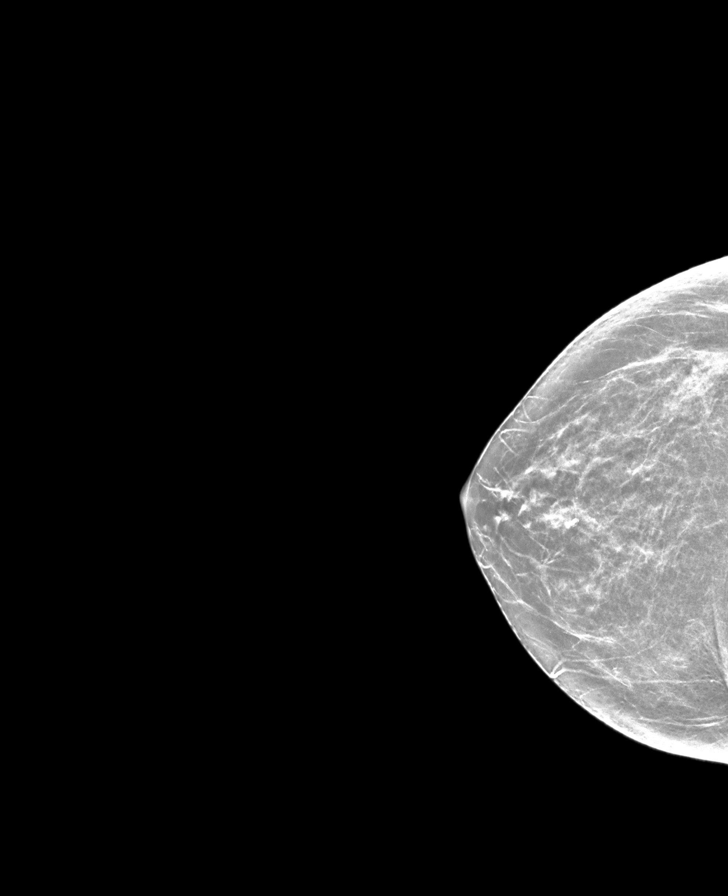

[R MLO synth-2D]
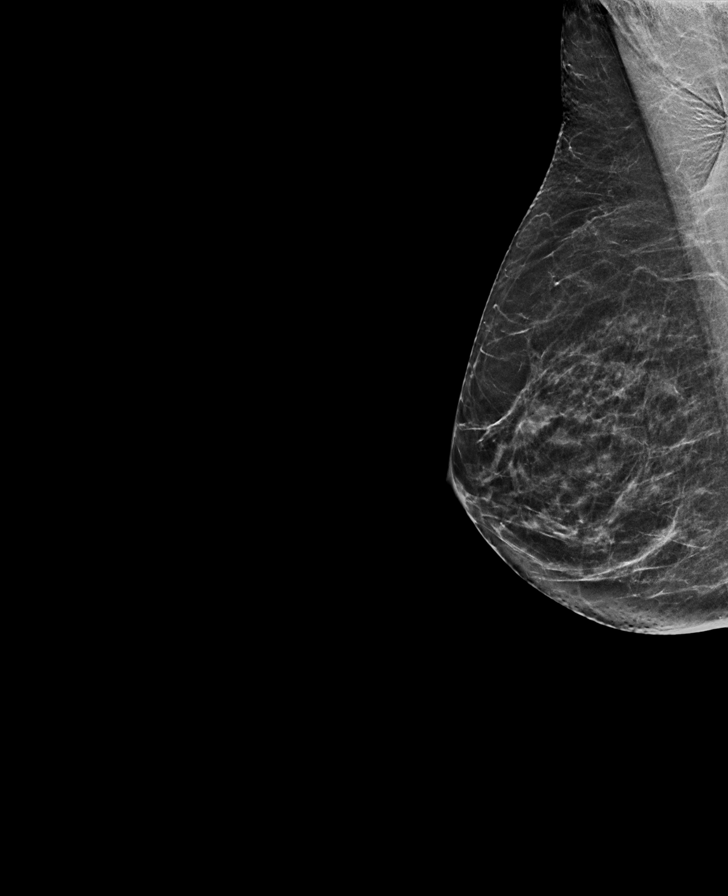

[L CC tomo · tomo slice 31/60.0]
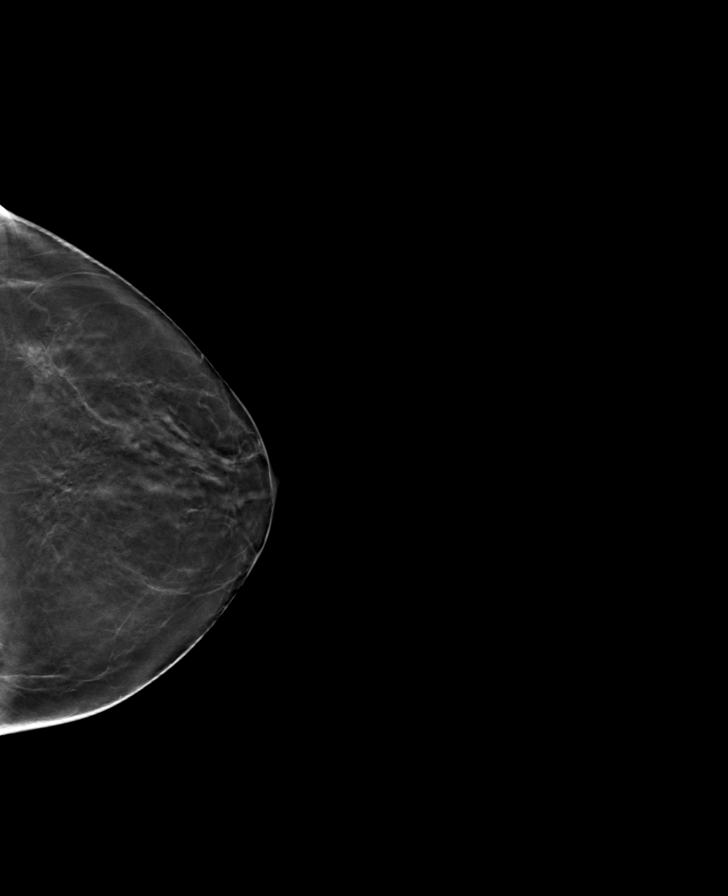

[R CC tomo · tomo slice 29/58.0]
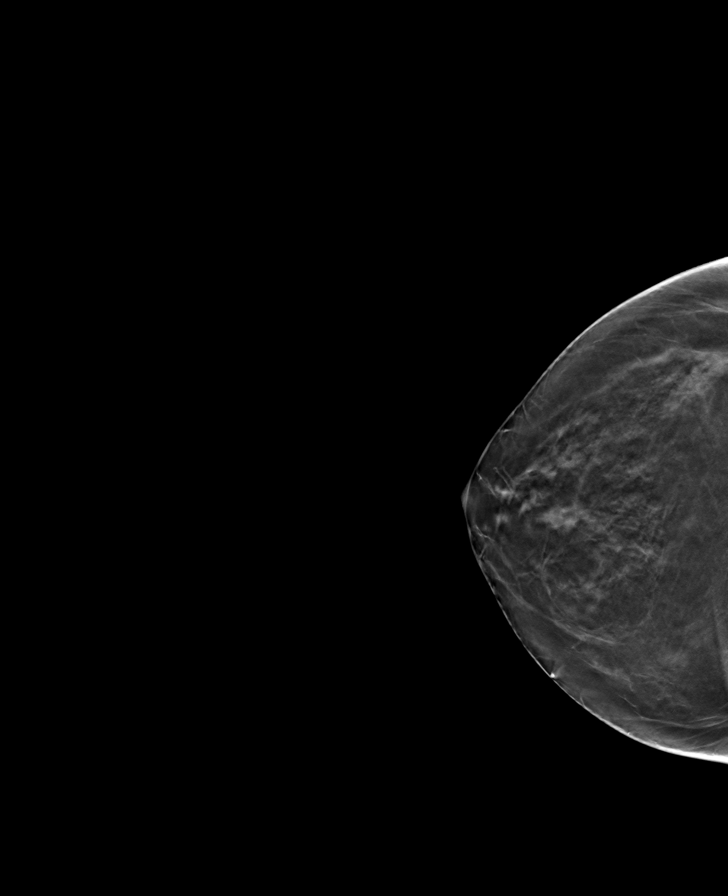

[R MLO tomo · tomo slice 30/59.0]
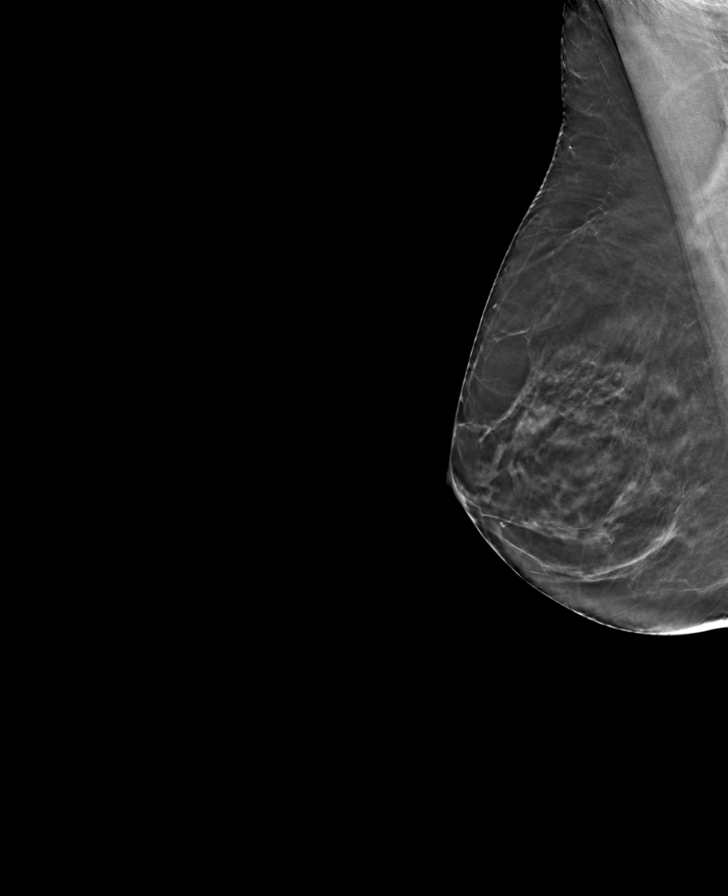

[L MLO tomo · tomo slice 33/65.0]
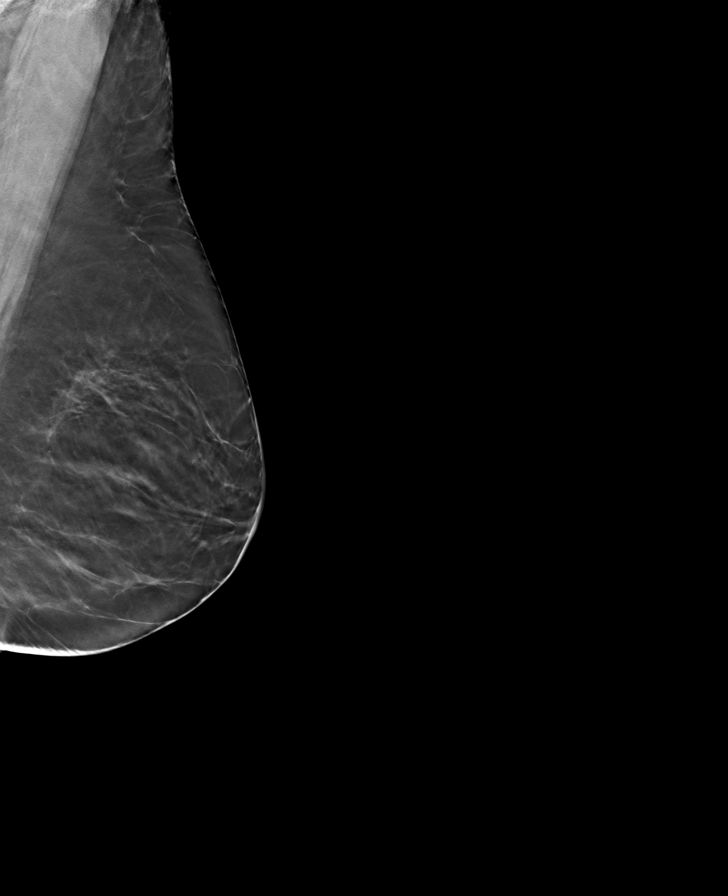

[8 of 24 positions shown; findings below may reference images not displayed]

ACR Breast Density Category b: There are scattered areas of
fibroglandular density.
FINDINGS: There are no findings suspicious for malignancy. Images were
processed with CAD.
IMPRESSION: No mammographic evidence of malignancy. A result letter of this
screening mammogram will be mailed directly to the patient.

RECOMMENDATION:
Screening mammogram in one year. (Code:[id number])

BI-RADS CATEGORY  1: Negative.

## 2021-12-31 DIAGNOSIS — L900 Lichen sclerosus et atrophicus: Secondary | ICD-10-CM | POA: Diagnosis not present

## 2021-12-31 DIAGNOSIS — L814 Other melanin hyperpigmentation: Secondary | ICD-10-CM | POA: Diagnosis not present

## 2021-12-31 DIAGNOSIS — D2262 Melanocytic nevi of left upper limb, including shoulder: Secondary | ICD-10-CM | POA: Diagnosis not present

## 2021-12-31 DIAGNOSIS — D2261 Melanocytic nevi of right upper limb, including shoulder: Secondary | ICD-10-CM | POA: Diagnosis not present

## 2021-12-31 DIAGNOSIS — D2272 Melanocytic nevi of left lower limb, including hip: Secondary | ICD-10-CM | POA: Diagnosis not present

## 2021-12-31 DIAGNOSIS — D225 Melanocytic nevi of trunk: Secondary | ICD-10-CM | POA: Diagnosis not present

## 2021-12-31 DIAGNOSIS — L821 Other seborrheic keratosis: Secondary | ICD-10-CM | POA: Diagnosis not present

## 2021-12-31 DIAGNOSIS — X32XXXA Exposure to sunlight, initial encounter: Secondary | ICD-10-CM | POA: Diagnosis not present

## 2021-12-31 DIAGNOSIS — D2271 Melanocytic nevi of right lower limb, including hip: Secondary | ICD-10-CM | POA: Diagnosis not present

## 2022-01-01 DIAGNOSIS — K222 Esophageal obstruction: Secondary | ICD-10-CM | POA: Diagnosis not present

## 2022-01-01 DIAGNOSIS — R69 Illness, unspecified: Secondary | ICD-10-CM | POA: Diagnosis not present

## 2022-01-01 DIAGNOSIS — Z7689 Persons encountering health services in other specified circumstances: Secondary | ICD-10-CM | POA: Diagnosis not present

## 2022-01-01 DIAGNOSIS — J302 Other seasonal allergic rhinitis: Secondary | ICD-10-CM | POA: Diagnosis not present

## 2022-01-01 DIAGNOSIS — B001 Herpesviral vesicular dermatitis: Secondary | ICD-10-CM | POA: Diagnosis not present

## 2022-01-01 DIAGNOSIS — Z1331 Encounter for screening for depression: Secondary | ICD-10-CM | POA: Diagnosis not present

## 2022-01-01 DIAGNOSIS — F419 Anxiety disorder, unspecified: Secondary | ICD-10-CM | POA: Diagnosis not present

## 2022-01-03 DIAGNOSIS — B001 Herpesviral vesicular dermatitis: Secondary | ICD-10-CM | POA: Diagnosis not present

## 2022-01-03 DIAGNOSIS — J302 Other seasonal allergic rhinitis: Secondary | ICD-10-CM | POA: Diagnosis not present

## 2022-01-03 DIAGNOSIS — R69 Illness, unspecified: Secondary | ICD-10-CM | POA: Diagnosis not present

## 2022-01-03 DIAGNOSIS — K222 Esophageal obstruction: Secondary | ICD-10-CM | POA: Diagnosis not present

## 2022-01-03 DIAGNOSIS — Z0000 Encounter for general adult medical examination without abnormal findings: Secondary | ICD-10-CM | POA: Diagnosis not present

## 2022-01-03 DIAGNOSIS — Z7689 Persons encountering health services in other specified circumstances: Secondary | ICD-10-CM | POA: Diagnosis not present

## 2022-01-09 DIAGNOSIS — Z01419 Encounter for gynecological examination (general) (routine) without abnormal findings: Secondary | ICD-10-CM | POA: Diagnosis not present

## 2022-01-09 DIAGNOSIS — Z1231 Encounter for screening mammogram for malignant neoplasm of breast: Secondary | ICD-10-CM | POA: Diagnosis not present

## 2022-01-21 ENCOUNTER — Other Ambulatory Visit: Payer: Self-pay | Admitting: Obstetrics

## 2022-01-21 DIAGNOSIS — Z1231 Encounter for screening mammogram for malignant neoplasm of breast: Secondary | ICD-10-CM

## 2022-04-17 ENCOUNTER — Ambulatory Visit
Admission: RE | Admit: 2022-04-17 | Discharge: 2022-04-17 | Disposition: A | Discharge status: Home / Self Care | Payer: 59 | Source: Ambulatory Visit | Attending: Obstetrics | Admitting: Obstetrics

## 2022-04-17 DIAGNOSIS — Z1231 Encounter for screening mammogram for malignant neoplasm of breast: Secondary | ICD-10-CM | POA: Diagnosis present

## 2023-02-14 ENCOUNTER — Other Ambulatory Visit: Payer: Self-pay | Admitting: Obstetrics

## 2023-02-14 DIAGNOSIS — Z1231 Encounter for screening mammogram for malignant neoplasm of breast: Secondary | ICD-10-CM

## 2023-04-22 ENCOUNTER — Ambulatory Visit
Admission: RE | Admit: 2023-04-22 | Discharge: 2023-04-22 | Disposition: A | Discharge status: Home / Self Care | Payer: 59 | Source: Ambulatory Visit | Attending: Obstetrics | Admitting: Obstetrics

## 2023-04-22 DIAGNOSIS — Z1231 Encounter for screening mammogram for malignant neoplasm of breast: Secondary | ICD-10-CM | POA: Insufficient documentation

## 2024-01-09 ENCOUNTER — Other Ambulatory Visit: Payer: Self-pay | Admitting: Internal Medicine

## 2024-01-09 DIAGNOSIS — Z1231 Encounter for screening mammogram for malignant neoplasm of breast: Secondary | ICD-10-CM

## 2024-04-22 ENCOUNTER — Ambulatory Visit
Admission: RE | Admit: 2024-04-22 | Discharge: 2024-04-22 | Disposition: A | Discharge status: Home / Self Care | Source: Ambulatory Visit | Attending: Internal Medicine | Admitting: Internal Medicine

## 2024-04-22 DIAGNOSIS — Z1231 Encounter for screening mammogram for malignant neoplasm of breast: Secondary | ICD-10-CM | POA: Insufficient documentation
